# Patient Record
Sex: Female | Born: 2000 | Race: Black or African American | Hispanic: No | Marital: Single | Smoking: Never smoker
Health system: Southern US, Community
[De-identification: ages and names within clinical notes are randomized; demographics above are authoritative.]

## PROBLEM LIST (undated history)

## (undated) DIAGNOSIS — R011 Cardiac murmur, unspecified: Secondary | ICD-10-CM

## (undated) DIAGNOSIS — T7840XA Allergy, unspecified, initial encounter: Secondary | ICD-10-CM

## (undated) DIAGNOSIS — S62609A Fracture of unspecified phalanx of unspecified finger, initial encounter for closed fracture: Secondary | ICD-10-CM

## (undated) DIAGNOSIS — T148XXA Other injury of unspecified body region, initial encounter: Secondary | ICD-10-CM

## (undated) HISTORY — DX: Allergy, unspecified, initial encounter: T78.40XA

## (undated) HISTORY — DX: Other injury of unspecified body region, initial encounter: T14.8XXA

## (undated) HISTORY — DX: Fracture of unspecified phalanx of unspecified finger, initial encounter for closed fracture: S62.609A

## (undated) HISTORY — PX: NO PAST SURGERIES: SHX2092

---
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
## 2020-01-06 ENCOUNTER — Telehealth: Payer: BC Managed Care – PPO | Admitting: Child and Adolescent Psychiatry

## 2020-01-06 ENCOUNTER — Other Ambulatory Visit: Payer: Self-pay

## 2020-01-06 NOTE — Progress Notes (Unsigned)
Virtual Visit via Video Note  I connected with Toni Roberts on 01/06/20 at 11:30 AM EDT by a video enabled telemedicine application and verified that I am speaking with the correct person using two identifiers.  Location: Patient: home Provider: home office in [place]   I discussed the limitations of evaluation and management by telemedicine and the availability of in person appointments. The patient expressed understanding and agreed to proceed.    I discussed the assessment and treatment plan with the patient. The patient was provided an opportunity to ask questions and all were answered. The patient agreed with the plan and demonstrated an understanding of the instructions.   The patient was advised to call back or seek an in-person evaluation if the symptoms worsen or if the condition fails to improve as anticipated.  I provided 25 minutes of non-face-to-face time during this encounter.   Toni SmallingHiren M Ima Hafner, MD    [facility] OP Progress Note  01/06/2020 11:40 AM Toni Roberts  MRN:  [id number]  Chief Complaint: Medication management follow-up for anxiety, depression, ADHD.  HPI: This is an 19 year old African-American female, currently attending [name] with medical history significant of concussion in 2019 and psychiatric history significant of depression, anxiety and ADHD was seen and evaluated over telemedicine encounter for medication management follow-up.  At the last appointment patient was recommended to continue Effexor XR 37.5 mg once a day and Vyvanse 20 mg once a day.  Patient had an appointment for follow-up in July however she did not show up for that appointment and scheduled follow-up today.  Toni Roberts reports that she stopped taking both her Effexor and Vyvanse last week.  She reports that she has been taking her medications every day since the last appointment with this writer however her prescription for Vyvanse was only sent for 30 days in June  and not clear if she was still taking Vyvanse.  She reports that she stopped taking these medications because she felt "robotic", on the medications.  She reports that she has been struggling with paying attention, gets easily distracted and has been behind with a lot of her classes because of her attention problem. She reports that not being able to pay attention to her class and her basketball games is frustrating to her.  She reports that she has also been feeling depressed, irritable, has lack of motivation, sleeping well, feeling tired and denies any thoughts of suicide or self-harm.  She denies problems with anxiety and rates her anxiety at 4 out of 10(10 = most anxious).  Current stressors include not being able to do well in the college.  We discussed to take stepwise approach with medication management.  Discussed to try medication for ADHD first and then consider medication for depression.  We discussed to try Concerta 18 mg once a day and and continue with individual therapy for now for depression.  We discussed to have follow-up every week for therapy.  We discussed to consider medication for depression at the next appointment in 2 weeks.  Patient verbalized understanding and agreed with plan.  Visit Diagnosis:  No diagnosis found.   Past Psychiatric History: As mentioned in initial H&P, reviewed today, no change  Past Medical History:  Past Medical History:  Diagnosis Date  . Allergy    seasonal  . Fracture, finger 2014   right hand  . Heart murmur    recently dx by Dr Marney SettingByrnett-pts mom said this is the 1st time a doctor has  told her this-pt asymptomatic per [facility], pts mom  . Torn ligament 2017   left foot    Past Surgical History:  Procedure Laterality Date  . INGUINAL HERNIA REPAIR Right 07/01/2016   Primary repair right indirect inguinal hernia. Surgeon: Earline Mayotte, MD;  Location: [facility] ORS;  Service: General;  Laterality: Right;  . NO PAST SURGERIES      Family  Psychiatric History: As mentioned in initial H&P, reviewed today, no change  Family History:  Family History  Problem Relation Age of Onset  . Cancer Neg Hx     Social History:  Social History   Socioeconomic History  . Marital status: Single    Spouse name: Not on file  . Number of children: Not on file  . Years of education: Not on file  . Highest education level: Not on file  Occupational History  . Not on file  Tobacco Use  . Smoking status: Never Smoker  . Smokeless tobacco: Never Used  Substance and Sexual Activity  . Alcohol use: No    Alcohol/week: 0.0 standard drinks  . Drug use: No  . Sexual activity: Not on file  Other Topics Concern  . Not on file  Social History Narrative  . Not on file   Social Determinants of Health   Financial Resource Strain:   . Difficulty of Paying Living Expenses: Not on file  Food Insecurity:   . Worried About Programme researcher, broadcasting/film/video in the Last Year: Not on file  . Ran Out of Food in the Last Year: Not on file  Transportation Needs:   . Lack of Transportation (Medical): Not on file  . Lack of Transportation (Non-Medical): Not on file  Physical Activity:   . Days of Exercise per Week: Not on file  . Minutes of Exercise per Session: Not on file  Stress:   . Feeling of Stress : Not on file  Social Connections:   . Frequency of Communication with Friends and Family: Not on file  . Frequency of Social Gatherings with Friends and Family: Not on file  . Attends Religious Services: Not on file  . Active Member of Clubs or Organizations: Not on file  . Attends Banker Meetings: Not on file  . Marital Status: Not on file    Allergies: No Known Allergies  Metabolic Disorder Labs: No results found for: HGBA1C, MPG No results found for: PROLACTIN No results found for: CHOL, TRIG, HDL, CHOLHDL, VLDL, LDLCALC No results found for: TSH  Therapeutic Level Labs: No results found for: LITHIUM No results found for:  VALPROATE No components found for:  CBMZ  Current Medications: Current Outpatient Medications  Medication Sig Dispense Refill  . cetirizine (ZYRTEC) 10 MG tablet Take 10 mg by mouth as needed for allergies.    . cetirizine (ZYRTEC) 10 MG tablet Take by mouth.    . fluticasone (FLONASE) 50 MCG/ACT nasal spray Place 1 spray into both nostrils daily as needed for allergies or rhinitis.    . fluticasone (FLONASE) 50 MCG/ACT nasal spray Place into the nose.    Marland Kitchen Ketotifen Fumarate (ALLERGY EYE DROPS OP) Apply 1 drop to eye daily as needed (allergies).    . methylphenidate (CONCERTA) 18 MG PO CR tablet Take 1 tablet (18 mg total) by mouth daily. 30 tablet 0  . Olopatadine HCl 0.2 % SOLN INSTILL 1 2 DROPS INTO BOTH EYES ONCE A DAY    . Saline (OCEAN NASAL SPRAY NA) Place 1 spray into  the nose as needed.    . traZODone (DESYREL) 50 MG tablet TAKE 1/2-1 TABLET (25-50 MG TOTAL) BY MOUTH AT BEDTIME AS NEEDED FOR SLEEP. 30 tablet 0   No current facility-administered medications for this visit.     Musculoskeletal: Strength & Muscle Tone: unable to assess since visit was over the telemedicine. Gait & Station: As mentioned in initial H&P, reviewed today, no change Patient leans: N/A  Psychiatric Specialty Exam: Review of Systems  There were no vitals taken for this visit.There is no height or weight on file to calculate BMI.  General Appearance: Casual, Fairly Groomed and wearing mask  Eye Contact:  Fair  Speech:  Clear and Coherent and Normal Rate  Volume:  Normal  Mood:  "ok"  Affect:  Appropriate, Congruent and Restricted  Thought Process:  Goal Directed and Linear  Orientation:  Full (Time, Place, and Person)  Thought Content: Logical   Suicidal Thoughts:  No  Homicidal Thoughts:  No  Memory:  Immediate;   Fair Recent;   Fair Remote;   Fair  Judgement:  Fair  Insight:  Fair  Psychomotor Activity:  Normal  Concentration:  Concentration: Fair and Attention Span: Fair  Recall:  [employer] of Knowledge: Fair  Language: Fair  Akathisia:  No    AIMS (if indicated): not done  Assets:  Manufacturing systems engineer Desire for Improvement Financial Resources/Insurance Housing Leisure Time Physical Health Social Support Transportation Vocational/Educational  ADL's:  Intact  Cognition: WNL  Sleep:  Fair   Screenings: For psychological evaluation 07/07/2019.  Patient had Margaret Pyle test of Cognitive abilities fourth edition, controlled oral Word Association test, snap 4, client interview and observation.  Summary and the recommendation of the psychological evaluation was reviewed.  Patient was diagnosed with ADHD and unspecified depressive disorder on psychological evaluation.   Assessment and Plan:   19 year-old AA female with prior psychiatric history of unspecified depressive disorder, anxiety disorder, ADHD inattentive type, referred for medication management for Depression, Anxiety and ADHD in 08/2019.  She does not appear to have any genetic predisposition to psychiatric illness.  During the initial evaluation she reported problems with attention, symptoms of depression(anhedonia, lack of motivation, poor appetite, sleeping problems, poor concentration and intermittent sad mood), anxiety(intermittent anxiety symptoms, panic attacks).  On initial evaluation she often responded to questions with vague answers.  She did not report any psychosocial stressors that have led to precipitation of depression and anxiety in the high school however it appears that transition to college, doing poorly with grades at college had worsened her anxiety and depression.  She appears to have strong social supports from her parents, future oriented, is engaged in sports, continues to enjoy leisurely activities, help seeking and in therapy which are protective factors for her.  Her presentation initially appeared most consistent with unspecified depressive disorder, unspecified anxiety disorder  and ADHD. She was therefore started on Vyvanse for ADHD and Effexor XR for mood and anxiety.   She has stopped taking both Vyvanse and Effexor because she does not believe it was helping her and also was making her "robotic".  It is unclear if patient was compliant to Vyvanse because last prescription was sent in June with 30-day supply however she reports that she was compliant to her medications.  Today she reports that she has been noticing worsening of attention problems and depressive symptoms.  She reports anxiety stable.  She reports that she continues to see therapist once a week.  We discussed to  start medication for ADHD first and in 2 weeks consider starting medication for depression.  She verbalizes understanding and agrees with the plan.  We discussed to try Concerta 18 mg once a day.  Discussed risks and benefits of starting Concerta and patient verbalized understanding and agreed with the plan.   Plan:  # ADHD(not improving) -Patient self discontinued Vyvanse 20 mg daily.  Recommending to start Concerta 18 mg once a day.  Prescription sent to [facility] in [place].  # Depression(not improving) and Anxiety(chronic, improving) -Patient has stopped taking Effexor XR 37.5 mg once a day.  Because of previous failed trials with antidepressant and her complaints of nonspecific side effects, recommended to start medication for ADHD.  With the plan to start antidepressant at next appointment.  Patient verbalized understanding and agreed with the plan.   -Continue individual therapy with Ms. Carollee Herter every week.   # Sleeping difficulties -Continue trazodone 25-50 mg QHS for sleep.   This note was generated in part or whole with voice recognition software. Voice recognition is usually quite accurate but there are transcription errors that can and very often do occur. I apologize for any typographical errors that were not detected and corrected.    Toni Smalling,  MD 01/06/2020, 11:40 AM

## 2020-02-06 ENCOUNTER — Telehealth: Payer: BC Managed Care – PPO | Admitting: Child and Adolescent Psychiatry

## 2020-02-13 ENCOUNTER — Telehealth (INDEPENDENT_AMBULATORY_CARE_PROVIDER_SITE_OTHER): Payer: BC Managed Care – PPO | Admitting: Child and Adolescent Psychiatry

## 2020-02-13 ENCOUNTER — Other Ambulatory Visit: Payer: Self-pay

## 2020-02-13 DIAGNOSIS — F411 Generalized anxiety disorder: Secondary | ICD-10-CM

## 2020-02-13 DIAGNOSIS — F3341 Major depressive disorder, recurrent, in partial remission: Secondary | ICD-10-CM

## 2020-02-13 DIAGNOSIS — F902 Attention-deficit hyperactivity disorder, combined type: Secondary | ICD-10-CM

## 2020-02-13 MED ORDER — HYDROXYZINE HCL 25 MG PO TABS: 25.0000 mg | ORAL_TABLET | Freq: Every evening | ORAL | 0 refills | Status: AC | PRN

## 2020-02-13 MED ORDER — ATOMOXETINE HCL 10 MG PO CAPS: 10.0000 mg | ORAL_CAPSULE | Freq: Every day | ORAL | 0 refills | Status: DC

## 2020-02-13 NOTE — Progress Notes (Signed)
Virtual Visit via Video Note  I connected with Toni Roberts on 02/13/20 at 11:00 AM EST by a video enabled telemedicine application and verified that I am speaking with the correct person using two identifiers.  Location: Patient: Toni Roberts, [place] Provider: home    I discussed the limitations of evaluation and management by telemedicine and the availability of in person appointments. The patient expressed understanding and agreed to proceed.    I discussed the assessment and treatment plan with the patient. The patient was provided an opportunity to ask questions and all were answered. The patient agreed with the plan and demonstrated an understanding of the instructions.   The patient was advised to call back or seek an in-person evaluation if the symptoms worsen or if the condition fails to improve as anticipated.  I provided 25 minutes of non-face-to-face time during this encounter.   Darcel Smalling, MD    [facility] MD/PA/NP OP Progress Note  02/13/2020 11:36 AM Toni Roberts  MRN:  [id number]  Chief Complaint: Medication management follow-up for anxiety, depression, ADHD.  HPI: This is an 19 year old African-American female, currently [name] with medical history significant of concussion in 2019 and psychiatric history significant of depression, anxiety and ADHD was seen and evaluated over telemedicine encounter for medication management follow-up.  At the last appointment patient was recommended to start Concerta 18 mg once a day and prior to her last appointment she already discontinued her Vyvanse and Effexor.  In the interim since the last appointment patient no showed for her follow-up appointment and scheduled to follow-up for today.  Today she reports that she continues to struggle with attention problem and need medications to help her with ADHD.  She reports that she has not been taking Concerta since she ran out and Concerta was  helping her a little but also causing more anxiety and made her feel depressed.  She reports that since discontinuation of Concerta she is not feeling anxious and her mood has been neutral.    In regards of anxiety she reports that her anxiety has been stable and states her anxiety is 2 out of 10(10 = most anxious).  She describes her mood as "just there", denies any low lows or depressed mood.  She denies any SI, anhedonia.  She reports that she continues to struggle with onset of sleep and has been taking NyQuil which has been helpful.  She reports that she continues to struggle with appetite but it was much worse on Concerta.  She reports that she continues to see her therapist about once a week and enjoys working with her therapist.  She reports that since the last appointment she was able to catch up with her college work and her college has been decent.  She reports that she continues to play with her school's basketball team.  We discussed that since she did not do well on Vyvanse or Concerta, would recommend trying Strattera.  Discussed risks and benefits including but not limited to black box warning of suicidal thoughts associated with Strattera.  Patient verbalized understanding and provided verbal informed consent.  We discussed to have follow-up in 3 weeks or earlier if needed.   Visit Diagnosis:    ICD-10-CM   1. Attention deficit hyperactivity disorder (ADHD), combined type  F90.2 atomoxetine (STRATTERA) 10 MG capsule  2. Recurrent major depressive disorder, in partial remission (HCC)  F33.41   3. Generalized anxiety disorder  F41.1 hydrOXYzine (ATARAX/VISTARIL) 25 MG tablet  Past Psychiatric History:  Zoloft (upto 75 mg) caused anxiety Effexor - Felt Robotic Remeron - Not helpful Concerta - reported worsening of mood and anxiety Vyvanse - Felt robotic.   Past Medical History:  Past Medical History:  Diagnosis Date  . Allergy    seasonal  . Fracture, finger 2014    right hand  . Heart murmur    recently dx by Dr Marney Setting mom said this is the 1st time a doctor has told her this-pt asymptomatic per [facility], pts mom  . Torn ligament 2017   left foot    Past Surgical History:  Procedure Laterality Date  . INGUINAL HERNIA REPAIR Right 07/01/2016   Primary repair right indirect inguinal hernia. Surgeon: Earline Mayotte, MD;  Location: [facility] ORS;  Service: General;  Laterality: Right;  . NO PAST SURGERIES      Family Psychiatric History: As mentioned in initial H&P, reviewed today, no change  Family History:  Family History  Problem Relation Age of Onset  . Cancer Neg Hx     Social History:  Social History   Socioeconomic History  . Marital status: Single    Spouse name: Not on file  . Number of children: Not on file  . Years of education: Not on file  . Highest education level: Not on file  Occupational History  . Not on file  Tobacco Use  . Smoking status: Never Smoker  . Smokeless tobacco: Never Used  Substance and Sexual Activity  . Alcohol use: No    Alcohol/week: 0.0 standard drinks  . Drug use: No  . Sexual activity: Not on file  Other Topics Concern  . Not on file  Social History Narrative  . Not on file   Social Determinants of Health   Financial Resource Strain:   . Difficulty of Paying Living Expenses: Not on file  Food Insecurity:   . Worried About Programme researcher, broadcasting/film/video in the Last Year: Not on file  . Ran Out of Food in the Last Year: Not on file  Transportation Needs:   . Lack of Transportation (Medical): Not on file  . Lack of Transportation (Non-Medical): Not on file  Physical Activity:   . Days of Exercise per Week: Not on file  . Minutes of Exercise per Session: Not on file  Stress:   . Feeling of Stress : Not on file  Social Connections:   . Frequency of Communication with Friends and Family: Not on file  . Frequency of Social Gatherings with Friends and Family: Not on file  . Attends Religious  Services: Not on file  . Active Member of Clubs or Organizations: Not on file  . Attends Banker Meetings: Not on file  . Marital Status: Not on file    Allergies: No Known Allergies  Metabolic Disorder Labs: No results found for: HGBA1C, MPG No results found for: PROLACTIN No results found for: CHOL, TRIG, HDL, CHOLHDL, VLDL, LDLCALC No results found for: TSH  Therapeutic Level Labs: No results found for: LITHIUM No results found for: VALPROATE No components found for:  CBMZ  Current Medications: Current Outpatient Medications  Medication Sig Dispense Refill  . atomoxetine (STRATTERA) 10 MG capsule Take 1 capsule (10 mg total) by mouth daily. 30 capsule 0  . cetirizine (ZYRTEC) 10 MG tablet Take 10 mg by mouth as needed for allergies.    . cetirizine (ZYRTEC) 10 MG tablet Take by mouth.    . fluticasone (FLONASE) 50 MCG/ACT nasal spray  Place 1 spray into both nostrils daily as needed for allergies or rhinitis.    . fluticasone (FLONASE) 50 MCG/ACT nasal spray Place into the nose.    . hydrOXYzine (ATARAX/VISTARIL) 25 MG tablet Take 1 tablet (25 mg total) by mouth at bedtime as needed (Sleep). 30 tablet 0  . Ketotifen Fumarate (ALLERGY EYE DROPS OP) Apply 1 drop to eye daily as needed (allergies).    . Olopatadine HCl 0.2 % SOLN INSTILL 1 2 DROPS INTO BOTH EYES ONCE A DAY    . Saline (OCEAN NASAL SPRAY NA) Place 1 spray into the nose as needed.    . traZODone (DESYREL) 50 MG tablet TAKE 1/2-1 TABLET (25-50 MG TOTAL) BY MOUTH AT BEDTIME AS NEEDED FOR SLEEP. 30 tablet 0   No current facility-administered medications for this visit.     Musculoskeletal: Strength & Muscle Tone: unable to assess since visit was over the telemedicine. Gait & Station: As mentioned in initial H&P, reviewed today, no change Patient leans: N/A  Psychiatric Specialty Exam: Review of Systems  There were no vitals taken for this visit.There is no height or weight on file to calculate BMI.   General Appearance: Casual and Fairly Groomed  Eye Contact:  Fair  Speech:  Clear and Coherent and Normal Rate  Volume:  Normal  Mood:  "just there.."  Affect:  Appropriate, Congruent and Restricted  Thought Process:  Goal Directed and Linear  Orientation:  Full (Time, Place, and Person)  Thought Content: Logical   Suicidal Thoughts:  No  Homicidal Thoughts:  No  Memory:  Immediate;   Fair Recent;   Fair Remote;   Fair  Judgement:  Fair  Insight:  Fair  Psychomotor Activity:  Normal  Concentration:  Concentration: Fair and Attention Span: Fair  Recall:  [employer] of Knowledge: Fair  Language: Fair  Akathisia:  No    AIMS (if indicated): not done  Assets:  Manufacturing systems engineer Desire for Improvement Financial Resources/Insurance Housing Leisure Time Physical Health Social Support Transportation Vocational/Educational  ADL's:  Intact  Cognition: WNL  Sleep:  Fair   Screenings: For psychological evaluation 07/07/2019.  Patient had Margaret Pyle test of Cognitive abilities fourth edition, controlled oral Word Association test, snap 4, client interview and observation.  Summary and the recommendation of the psychological evaluation was reviewed.  Patient was diagnosed with ADHD and unspecified depressive disorder on psychological evaluation.   Assessment and Plan:   19 year-old AA female with prior psychiatric history of unspecified depressive disorder, anxiety disorder, ADHD inattentive type, referred for medication management for Depression, Anxiety and ADHD in 08/2019.  She does not appear to have any genetic predisposition to psychiatric illness.    During the initial evaluation she reported problems with attention, symptoms of depression(anhedonia, lack of motivation, poor appetite, sleeping problems, poor concentration and intermittent sad mood), anxiety(intermittent anxiety symptoms, panic attacks).  On initial evaluation she often responded to questions with  vague answers.  She did not report any psychosocial stressors that have led to precipitation of depression and anxiety in the high school however it appears that transition to college, doing poorly with grades at college had worsened her anxiety and depression.  She appears to have strong social supports from her parents, future oriented, is engaged in sports, continues to enjoy leisurely activities, help seeking and in therapy which are protective factors for her.  Her presentation initially appeared most consistent with unspecified depressive disorder, unspecified anxiety disorder and ADHD. She was therefore started on  Vyvanse for ADHD and Effexor XR for mood and anxiety on initial evaluation.   Subsequently she had been partly compliant to her meds, stopped Vyvanse and Effexor because she does not believe it was helping her and also was making her "robotic". She was then switched to concerta and recommended to follow up in 2 weeks. She now was followed up two months later and reported Concerta caused worsening of anxiety and depression, which improved with discontinuiation of concerta. Continues to struggle with ADHD, mood and anxiety appear stable. Recommending Straterra 10 mg daily due to poor response to Vyvanse and Concerta.    Plan:  # ADHD(not improving) -Start Straterra 10 mg daily - Side effects including but not limited to nausea, vomiting, diarrhea, constipation, headaches, dizziness, black box warning of suicidal thoughts with Blase MessStraterra were discussed with pt and parents. Mother provided informed consent.    # Depression(in remission) and Anxiety(chronic, stable) -Continue individual therapy with Ms. Carollee HerterAngie Dickey every week.   # Sleeping difficulties -Stop trazodone 25-50 mg QHS for sleep and start Atarax 25 mg QHS PRN for sleep.   This note was generated in part or whole with voice recognition software. Voice recognition is usually quite accurate but there are transcription  errors that can and very often do occur. I apologize for any typographical errors that were not detected and corrected.    Darcel SmallingHiren M Makara Lanzo, MD 02/13/2020, 11:36 AM

## 2020-03-06 ENCOUNTER — Other Ambulatory Visit: Payer: Self-pay

## 2020-03-06 ENCOUNTER — Encounter: Payer: Self-pay | Admitting: Child and Adolescent Psychiatry

## 2020-03-06 ENCOUNTER — Telehealth (INDEPENDENT_AMBULATORY_CARE_PROVIDER_SITE_OTHER): Payer: BC Managed Care – PPO | Admitting: Child and Adolescent Psychiatry

## 2020-03-06 DIAGNOSIS — F411 Generalized anxiety disorder: Secondary | ICD-10-CM | POA: Diagnosis not present

## 2020-03-06 DIAGNOSIS — F902 Attention-deficit hyperactivity disorder, combined type: Secondary | ICD-10-CM | POA: Diagnosis not present

## 2020-03-06 DIAGNOSIS — F331 Major depressive disorder, recurrent, moderate: Secondary | ICD-10-CM | POA: Diagnosis not present

## 2020-03-06 MED ORDER — ATOMOXETINE HCL 18 MG PO CAPS: 18.0000 mg | ORAL_CAPSULE | Freq: Every day | ORAL | 0 refills | Status: DC

## 2020-03-06 MED ORDER — BUPROPION HCL ER (XL) 150 MG PO TB24: 150.0000 mg | ORAL_TABLET | Freq: Every day | ORAL | 0 refills | Status: DC

## 2020-03-06 NOTE — Progress Notes (Signed)
Virtual Visit via Video Note  I connected with Toni Roberts on 03/06/20 at  1:00 PM EST by a video enabled telemedicine application and verified that I am speaking with the correct person using two identifiers.  Appointment attended by - Patient and Parent  Location: Patient: Toni Roberts, [place] Provider: home    I discussed the limitations of evaluation and management by telemedicine and the availability of in person appointments. The patient expressed understanding and agreed to proceed.    I discussed the assessment and treatment plan with the patient. The patient was provided an opportunity to ask questions and all were answered. The patient agreed with the plan and demonstrated an understanding of the instructions.   The patient was advised to call back or seek an in-person evaluation if the symptoms worsen or if the condition fails to improve as anticipated.  I provided 25 minutes of non-face-to-face time during this encounter.   Toni Smalling, MD    [facility] MD/PA/NP OP Progress Note  03/06/2020 2:19 PM Toni Roberts  MRN:  [id number]  Chief Complaint: Medication management follow-up for anxiety, depression, ADHD.  HPI: This is an 19 year old African-American female, currently attending [name] with medical history significant of concussion in 2019 and psychiatric history significant of depression, anxiety and ADHD was seen and evaluated over telemedicine encounter for medication management follow-up.    At the last appointment patient was recommended to start taking Strattera 10 mg once a day after self discontinuing Concerta.  Today she reports that she has been tolerating Strattera well without any side effects however has not noticed any improvement.  She also reports that she has been feeling more depressed for the last 1 month.  She reports that she has been feeling "numb", has low motivation, has problems with appetite and sleep, denies any  suicidal thoughts or self-harm thoughts.  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  In regards of anxiety she reports that her anxiety has been minimal and manageable.  She reports that she continues to see her therapist on a weekly basis.  She asked medication management options for her depression.  We discussed to try Wellbutrin XL 150 mg once a day.  Discussed risks and benefits including but not limited to black box warning associated with Wellbutrin XL.  She verbalized understanding and provided verbal informed consent.  She also asked if she can provide a letter for service dog.  Writer discussed limitations regarding providing such a letter.  She verbalized understanding.  We also discussed to increase Strattera to 18 mg once a day for the treatment for ADHD.  She verbalized understanding and agreed with plan.  Visit Diagnosis:    ICD-10-CM   1. Moderate episode of recurrent major depressive disorder (HCC)  F33.1 buPROPion (WELLBUTRIN XL) 150 MG 24 hr tablet  2. Attention deficit hyperactivity disorder (ADHD), combined type  F90.2 atomoxetine (STRATTERA) 18 MG capsule  3. Generalized anxiety disorder  F41.1 buPROPion (WELLBUTRIN XL) 150 MG 24 hr tablet     Past Psychiatric History:  Zoloft (upto 75 mg) caused anxiety Effexor - Felt Robotic Remeron - Not helpful Concerta - reported worsening of mood and anxiety Vyvanse - Felt robotic.   Past Medical History:  Past Medical History:  Diagnosis Date   Allergy    seasonal   Fracture, finger 2014   right hand   Heart murmur    recently dx by Dr Marney Setting mom said this is the  1st time a doctor has told her this-pt asymptomatic per Toni Roberts, pts mom   Torn ligament 2017   left foot    Past Surgical History:  Procedure Laterality Date   INGUINAL HERNIA REPAIR Right 07/01/2016   Primary repair right indirect inguinal hernia. Surgeon: Earline MayotteJeffrey W Byrnett, MD;  Location: [facility] ORS;  Service: General;   Laterality: Right;   NO PAST SURGERIES      Family Psychiatric History: As mentioned in initial H&P, reviewed today, no change  Family History:  Family History  Problem Relation Age of Onset   Cancer Neg Hx     Social History:  Social History   Socioeconomic History   Marital status: Single    Spouse name: Not on file   Number of children: Not on file   Years of education: Not on file   Highest education level: Not on file  Occupational History   Not on file  Tobacco Use   Smoking status: Never Smoker   Smokeless tobacco: Never Used  Substance and Sexual Activity   Alcohol use: No    Alcohol/week: 0.0 standard drinks   Drug use: No   Sexual activity: Not on file  Other Topics Concern   Not on file  Social History Narrative   Not on file   Social Determinants of Health   Financial Resource Strain:    Difficulty of Paying Living Expenses: Not on file  Food Insecurity:    Worried About Running Out of Food in the Last Year: Not on file   Ran Out of Food in the Last Year: Not on file  Transportation Needs:    Lack of Transportation (Medical): Not on file   Lack of Transportation (Non-Medical): Not on file  Physical Activity:    Days of Exercise per Week: Not on file   Minutes of Exercise per Session: Not on file  Stress:    Feeling of Stress : Not on file  Social Connections:    Frequency of Communication with Friends and Family: Not on file   Frequency of Social Gatherings with Friends and Family: Not on file   Attends Religious Services: Not on file   Active Member of Clubs or Organizations: Not on file   Attends BankerClub or Organization Meetings: Not on file   Marital Status: Not on file    Allergies: No Known Allergies  Metabolic Disorder Labs: No results found for: HGBA1C, MPG No results found for: PROLACTIN No results found for: CHOL, TRIG, HDL, CHOLHDL, VLDL, LDLCALC No results found for: TSH  Therapeutic Level Labs: No  results found for: LITHIUM No results found for: VALPROATE No components found for:  CBMZ  Current Medications: Current Outpatient Medications  Medication Sig Dispense Refill   atomoxetine (STRATTERA) 18 MG capsule Take 1 capsule (18 mg total) by mouth daily. 30 capsule 0   buPROPion (WELLBUTRIN XL) 150 MG 24 hr tablet Take 1 tablet (150 mg total) by mouth daily. 30 tablet 0   cetirizine (ZYRTEC) 10 MG tablet Take 10 mg by mouth as needed for allergies.     cetirizine (ZYRTEC) 10 MG tablet Take by mouth.     fluticasone (FLONASE) 50 MCG/ACT nasal spray Place 1 spray into both nostrils daily as needed for allergies or rhinitis.     fluticasone (FLONASE) 50 MCG/ACT nasal spray Place into the nose.     hydrOXYzine (ATARAX/VISTARIL) 25 MG tablet Take 1 tablet (25 mg total) by mouth at bedtime as needed (Sleep). 30 tablet 0  Ketotifen Fumarate (ALLERGY EYE DROPS OP) Apply 1 drop to eye daily as needed (allergies).     Olopatadine HCl 0.2 % SOLN INSTILL 1 2 DROPS INTO BOTH EYES ONCE A DAY     Saline (OCEAN NASAL SPRAY NA) Place 1 spray into the nose as needed.     No current facility-administered medications for this visit.     Musculoskeletal: Strength & Muscle Tone: unable to assess since visit was over the telemedicine. Gait & Station: As mentioned in initial H&P, reviewed today, no change Patient leans: N/A  Psychiatric Specialty Exam: Review of Systems  There were no vitals taken for this visit.There is no height or weight on file to calculate BMI.  General Appearance: Casual and Fairly Groomed  Eye Contact:  Fair  Speech:  Clear and Coherent and Normal Rate  Volume:  Normal  Mood:  Depressed  Affect:  Appropriate, Congruent and Restricted  Thought Process:  Goal Directed and Linear  Orientation:  Full (Time, Place, and Person)  Thought Content: Logical   Suicidal Thoughts:  No  Homicidal Thoughts:  No  Memory:  Immediate;   Fair Recent;   Fair Remote;   Fair   Judgement:  Fair  Insight:  Fair  Psychomotor Activity:  Normal  Concentration:  Concentration: Fair and Attention Span: Fair  Recall:  [employer] of Knowledge: Fair  Language: Fair  Akathisia:  No    AIMS (if indicated): not done  Assets:  Communication Skills Desire for Improvement Financial Resources/Insurance Housing Leisure Time Physical Health Social Support Transportation Vocational/Educational  ADL's:  Intact  Cognition: WNL  Sleep:  Fair   Screenings: Psychological evaluation 07/07/2019.  Patient had Margaret Pyle test of Cognitive abilities fourth edition, controlled oral Word Association test, snap 4, client interview and observation.  Summary and the recommendation of the psychological evaluation was reviewed.  Patient was diagnosed with ADHD and unspecified depressive disorder on psychological evaluation.   Assessment and Plan:   19 year-old AA female with prior psychiatric history of unspecified depressive disorder, anxiety disorder, ADHD inattentive type, referred for medication management for Depression, Anxiety and ADHD in 08/2019.  She does not appear to have any genetic predisposition to psychiatric illness.    During the initial evaluation she reported problems with attention, symptoms of depression(anhedonia, lack of motivation, poor appetite, sleeping problems, poor concentration and intermittent sad mood), anxiety(intermittent anxiety symptoms, panic attacks).  On initial evaluation she often responded to questions with vague answers.  She did not report any psychosocial stressors that have led to precipitation of depression and anxiety in the high school however it appears that transition to college, doing poorly with grades at college had worsened her anxiety and depression.  She appears to have strong social supports from her parents, future oriented, is engaged in sports, continues to enjoy leisurely activities, help seeking and in therapy which are  protective factors for her.  Her presentation initially appeared most consistent with unspecified depressive disorder, unspecified anxiety disorder and ADHD. She was therefore started on Vyvanse for ADHD and Effexor XR for mood and anxiety on initial evaluation.   Subsequently she had been partly compliant to her meds, stopped Vyvanse and Effexor because she did not believe it was helping her and also was making her "robotic". She was then switched to concerta and recommended to follow up in 2 weeks and was followed up 2 months later at the last appointment and was started on Straterra for ADHD and reported improvement in mood  and anxiety, now she is reporting worsening of depression but stability in anxiety. Recommended to add Wellbutrin XL 150 mg daily, and increase Straterra to 18 mg daily.   Plan:  # ADHD(not improving) -Increase Straterra to 18 mg daily - Side effects including but not limited to nausea, vomiting, diarrhea, constipation, headaches, dizziness, black box warning of suicidal thoughts with Blase Mess were discussed with pt and parents. Mother provided informed consent.    # Depression(worse) and Anxiety(chronic, stable) -Continue individual therapy with Ms. Carollee Herter every week. - Start Wellbutrin XL 150 mg daily.    # Sleeping difficulties -Continue with Atarax 25 mg QHS PRN for sleep.   This note was generated in part or whole with voice recognition software. Voice recognition is usually quite accurate but there are transcription errors that can and very often do occur. I apologize for any typographical errors that were not detected and corrected.    Toni Smalling, MD 03/06/2020, 2:19 PM

## 2020-03-07 ENCOUNTER — Telehealth: Payer: BC Managed Care – PPO | Admitting: Child and Adolescent Psychiatry

## 2020-03-07 ENCOUNTER — Other Ambulatory Visit: Payer: Self-pay | Admitting: Child and Adolescent Psychiatry

## 2020-03-07 DIAGNOSIS — F411 Generalized anxiety disorder: Secondary | ICD-10-CM

## 2020-03-07 DIAGNOSIS — F902 Attention-deficit hyperactivity disorder, combined type: Secondary | ICD-10-CM

## 2020-04-03 ENCOUNTER — Telehealth: Payer: Self-pay | Admitting: *Deleted

## 2020-04-03 NOTE — Telephone Encounter (Signed)
Headaches can occur with Wellbutrin, migraine is rare occurrence with Wellbutrin. She should let the clinic know about the medications she is on. She can still continue taking these medications while on concussion protocol.

## 2020-04-03 NOTE — Telephone Encounter (Signed)
Patient called and wanted to know if her medications could possible be causing her migraines.  She is on a concusion protocol.  Please review and advise.  Should she talk to the clinic that put her on the protocol?

## 2020-04-04 NOTE — Telephone Encounter (Signed)
Thanks

## 2020-04-04 NOTE — Telephone Encounter (Signed)
Placed call to patient to notify

## 2020-04-11 ENCOUNTER — Other Ambulatory Visit: Payer: Self-pay | Admitting: Child and Adolescent Psychiatry

## 2020-04-11 DIAGNOSIS — F331 Major depressive disorder, recurrent, moderate: Secondary | ICD-10-CM

## 2020-04-11 DIAGNOSIS — F902 Attention-deficit hyperactivity disorder, combined type: Secondary | ICD-10-CM

## 2020-04-11 DIAGNOSIS — F411 Generalized anxiety disorder: Secondary | ICD-10-CM

## 2022-08-15 ENCOUNTER — Emergency Department: Payer: BC Managed Care – PPO

## 2022-08-15 ENCOUNTER — Emergency Department
Admission: EM | Admit: 2022-08-15 | Discharge: 2022-08-15 | Disposition: A | Discharge status: Home / Self Care | Payer: BC Managed Care – PPO | Attending: Emergency Medicine | Admitting: Emergency Medicine

## 2022-08-15 DIAGNOSIS — J029 Acute pharyngitis, unspecified: Secondary | ICD-10-CM | POA: Diagnosis present

## 2022-08-15 DIAGNOSIS — Z85850 Personal history of malignant neoplasm of thyroid: Secondary | ICD-10-CM | POA: Diagnosis not present

## 2022-08-15 LAB — COMPREHENSIVE METABOLIC PANEL
ALT: 10 U/L (ref 0–44)
AST: 20 U/L (ref 15–41)
Albumin: 4.5 g/dL (ref 3.5–5.0)
Alkaline Phosphatase: 42 U/L (ref 38–126)
Anion gap: 11 (ref 5–15)
BUN: 9 mg/dL (ref 6–20)
CO2: 23 mmol/L (ref 22–32)
Calcium: 9.5 mg/dL (ref 8.9–10.3)
Chloride: 102 mmol/L (ref 98–111)
Creatinine, Ser: 0.8 mg/dL (ref 0.44–1.00)
GFR, Estimated: >60 mL/min (ref >60)
Glucose, Bld: 92 mg/dL (ref 70–99)
Potassium: 3.5 mmol/L (ref 3.5–5.1)
Sodium: 136 mmol/L (ref 135–145)
Total Bilirubin: 0.7 mg/dL (ref 0.3–1.2)
Total Protein: 7.7 g/dL (ref 6.5–8.1)

## 2022-08-15 LAB — CBC
HCT: 41.8 % (ref 36.0–46.0)
Hemoglobin: 13.8 g/dL (ref 12.0–15.0)
MCH: 29.6 pg (ref 26.0–34.0)
MCHC: 33 g/dL (ref 30.0–36.0)
MCV: 89.7 fL (ref 80.0–100.0)
Platelets: 270 10*3/uL (ref 150–400)
RBC: 4.66 MIL/uL (ref 3.87–5.11)
RDW: 13.4 % (ref 11.5–15.5)
WBC: 8 10*3/uL (ref 4.0–10.5)
nRBC: 0 % (ref 0.0–0.2)

## 2022-08-15 LAB — POC URINE PREG, ED: Preg Test, Ur: NEGATIVE

## 2022-08-15 LAB — MONONUCLEOSIS SCREEN: Mono Screen: NEGATIVE

## 2022-08-15 MED ORDER — ALUM & MAG HYDROXIDE-SIMETH 200-200-20 MG/5ML PO SUSP
30.0000 mL | Freq: Once | ORAL | Status: AC
  Administered 2022-08-15: 30 mL via ORAL
  Filled 2022-08-15: qty 30

## 2022-08-15 NOTE — ED Triage Notes (Addendum)
Pt sts that she was at her PCP and they prescribed her an antibiotic and steroids. Pt sts that she started to feel better but his AM she woke up and her throat is hurting and she is concerned. Pt is drinking slushy  while in triage. Pt sts that she is feeling like something in her throat. 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

## 2022-08-15 NOTE — Discharge Instructions (Signed)
Follow-up with your regular doctor if not improving 3 days.  Your x-ray today is normal.  Your labs are normal.  Your thyroid level and monotest were result later today.  May check these on [facility] MyChart.  If the number on the thyroid is high that means you have hypothyroidism, if it is low that means you have hyperthyroidism and this would be a reason to follow-up with your regular doctor.  If your monotest is positive then you will need to avoid contact sports for 6 weeks. If the GI cocktail helps with your symptoms buy over-the-counter omeprazole to help with reflux symptoms

## 2022-08-15 NOTE — ED Provider Notes (Signed)
[facility] Provider Note    Event Date/Time   First MD Initiated Contact with Patient 08/15/22 1819     (approximate)   History   Sore Throat   HPI  Toni Roberts is a 22 y.o. 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  No fever or chills.  Has seen 2 previous doctors for the same.  Had a strep test performed which was negative.  Was given Z-Pak which she is currently taking.  Was seen at the urgent care and the emergency department where she goes to school.  Mother is concerned something is getting missed due to her past medical history.  Denies any concerns for STD in the throat.      Physical Exam   Triage Vital Signs: ED Triage Vitals  Enc Vitals Group     BP 08/15/22 1705 112/63     Pulse Rate 08/15/22 1705 72     Resp 08/15/22 1706 17     Temp 08/15/22 1705 98.7 F (37.1 C)     Temp Source 08/15/22 1705 Oral     SpO2 08/15/22 1705 96 %     Weight 08/15/22 1701 131 lb (59.4 kg)     Height --      Head Circumference --      Peak Flow --      Pain Score 08/15/22 1701 0     Pain Loc --      Pain Edu? --      Excl. in GC? --     Most recent vital signs: Vitals:   08/15/22 1705 08/15/22 1706  BP: 112/63   Pulse: 72   Resp:  17  Temp: 98.7 F (37.1 C)   SpO2: 96%      General: Awake, no distress.   CV:  Good peripheral perfusion. regular rate and  rhythm Resp:  Normal effort. Lungs cta Abd:  No distention.   Other:  Surgical scar noted at the anterior neck, neck is supple, no lymphadenopathy, right tonsil is a little larger than the left however do not see any exudate or concerns for PTA   ED Results / Procedures / Treatments   Labs (all labs ordered are listed, but only abnormal results are displayed) Labs Reviewed  CBC   COMPREHENSIVE METABOLIC PANEL  MONONUCLEOSIS SCREEN  TSH  POC URINE PREG, ED     EKG     RADIOLOGY X-ray soft tissue of the neck    PROCEDURES:   Procedures   MEDICATIONS ORDERED IN ED: Medications  alum & mag hydroxide-simeth (MAALOX/MYLANTA) 200-200-20 MG/5ML suspension 30 mL (has no administration in time range)     IMPRESSION / MDM / ASSESSMENT AND PLAN / ED COURSE  I reviewed the triage vital signs and the nursing notes.                              Differential diagnosis includes, but is not limited to, thyroid tumor, esophagitis mono, hypothyroidism,  Patient's presentation is most consistent with acute illness / injury with system symptoms.   Patient's basic labs are reassuring  For speaking with the patient and her mother I will add a monotest and TSH.  Explained to the mother they can look  these up on [facility] MyChart as will not change her treatment here in the ED.  However will do a soft tissue x-ray of the neck to ensure the airways patent and does not look like any new growth in the area.   X-ray soft tissue of the neck was independently reviewed interpreted by me as being negative for acute abnormality  Explained the findings to the patient and her family member.  She is to follow-up with her regular doctor.  Return emergency department if worsening.  She is to look on [facility] MyChart concerning her thyroid level and her monotest.  She is in agreement with the treatment plan.  Given a GI cocktail at discharge.  Instructed to buy over-the-counter omeprazole if this medication helps with her symptoms.   FINAL CLINICAL IMPRESSION(S) / ED DIAGNOSES   Final diagnoses:  Sore throat     Rx / DC Orders   ED Discharge Orders     None        Note:  This document was prepared using Dragon voice recognition software and may include unintentional dictation errors.    Faythe Ghee, PA-C 08/15/22 1946    Trinna Post, MD 08/15/22  2011

## 2022-08-15 NOTE — ED Triage Notes (Signed)
Brought over from [facility]. C/o feeling like something is stuck in her throat X8 days. Causing issues with swallowing.   History tumor on thyroid   99% RA 110/62 b/p 101P

## 2022-08-15 NOTE — ED Notes (Signed)
Pt provided discharge instructions and prescription information. Pt was given the opportunity to ask questions and questions were answered.   

## 2022-09-06 NOTE — Therapy (Unsigned)
OUTPATIENT PHYSICAL THERAPY EVALUATION   Patient Name: Toni Roberts MRN: [id number] DOB:[birth year], 22 y.o., female Today's Date: 09/08/2022  END OF SESSION:  PT End of Session - 09/08/22 1919     Visit Number 1    Number of Visits 24    Date for PT Re-Evaluation 12/01/22    Authorization Type [employer] reporting period from 09/08/2022    Authorization Time Period VL: 30 PT/OT/Chiro    Authorization - Visit Number 1    Authorization - Number of Visits 30    Progress Note Due on Visit 10    PT Start Time 1115    PT Stop Time 1205    PT Time Calculation (min) 50 min    Activity Tolerance Patient limited by pain    Behavior During Therapy [facility] for tasks assessed/performed             Past Medical History:  Diagnosis Date   Allergy    seasonal   Fracture, finger 2014   right hand   Heart murmur    recently dx by Dr Marney Setting mom said this is the 1st time a doctor has told her this-pt asymptomatic per Madolyn Frieze, pts mom   Torn ligament 2017   left foot   Past Surgical History:  Procedure Laterality Date   INGUINAL HERNIA REPAIR Right 07/01/2016   Primary repair right indirect inguinal hernia. Surgeon: Earline Mayotte, MD;  Location: [facility] ORS;  Service: General;  Laterality: Right;   NO PAST SURGERIES     Patient Active Problem List   Diagnosis Date Noted   Moderate episode of recurrent major depressive disorder (HCC) 12/22/2019   Anxiety disorder 08/09/2019   Attention deficit hyperactivity disorder (ADHD), combined type 08/09/2019   Other insomnia 08/09/2019   Non-recurrent unilateral inguinal hernia without obstruction or gangrene 06/11/2016   Peroneal tendon injury 08/30/2015   Left ankle swelling 08/30/2015   Plica syndrome of left knee 09/26/2014    PCP: Charlton Amor, MD   REFERRING PROVIDER: Berkley Harvey, MD   REFERRING DIAG: synovial plica syndrome of left knee, 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 She states her condition started March 01, 2022 during a game when she hyperextended her left knee when she jumped up to get  a ball. She had immediate pain. She went to the doctor 4-5 days later. She had MRI that showed a partialy tear of the MCL, two bone bruises, some swollen tendons, and pinched left fat pad. (Physician's notes say it showed a hyperextension injury). She did some rehab at school but it wasn't helping, so she got some exercises from her relative that is Tree surgeon. She was unable to finish her sports season, so she has been out of basketball since November 25th. She played college basketball for [employer] ([place] but she is not sure where she is going next year because she is transferring. Her goal is to go back to playing college basketball. She had cortisone shots and medications that did not resolve her pain and she was unable to get back to cutting, recover on defense, or be effective  on the court. Her doctor recommended surgery after she had ongoing pain "primarily over the medial condyle with a diagnostic plica injection providing temporarily incomplete relief." (From Dr. Lynford Humphrey' documentation 08/25/2022).  She is now 2 weeks s/p left knee arthroscopy that included a limited synovectomy to the medial plica, limited chondroplasty consisting of medial femoral condyle debridement of grade 1 changes, and debridement to hypertrophic fat pad and ligamentum mucosum. She wore a brace prior to surgery but not after surgery. She states she has been working on some  of the exercises she was given by a PT (TJ with [facility]) she saw twice post op, and her knee is getting a little straighter. She tries to walk without a crutch but she brought the crutch today because she though her knee was going to be hurting. She uses it when she needs it.  She is taking ibuprofen and tylenol for it. Patient's mother states current PT is to call DJ at [facility] at the request of TJ and Dr. Lynford Humphrey. Patient's mother states Dr. Lynford Humphrey said patient should not have any pain when she comes back for her follow up with the him. Patient reports she was having a lot of pain before the surgery. She was having 5-8/10 pain prior to surgery depending on what she was doing. She not able to drive right now because she has been told not to drive until her knee doesn't hurt while sitting in the car. She is planning to start working out again focusing on upper body and abs. Patient's mother Toni Roberts was present for part of session.   Per Dr. Roxanne Mins documentation from 08/25/2022, patient had surgery related to ongoing pain "primarily over the medial condyle with a diagnostic plica injection providing temporarily incomplete relief."  From Op note 08/25/2022 (emphasis from this PT):   "PF joint: Normal patella and trochlear cartilage, normal patellar tracking Lateral compartment: intact meniscus, normal femoral and tibial articular cartilage Medial compartment: Intact meniscus. Small grade I chondromalacia over the medial femoral condyle, prominent plica adjacent to chondromalacia, normal tibial articular cartilage Notch: Intact ACL and PCL Gutters: no loose bodies in lateral or medial gutters"  "The medial plica was identified. A limited synovectomy was performed using an arthroscopic biter and an oscillating shaver. The grade 1 changes along the medial femoral condyle were also debrided with a shaver to complete a limited chondroplasty. The hypertrophic fat pat and ligamentum mucosum were also debrided to  visualize the contents of the notch. Arthroscopic fluid was removed from the joint."    PERTINENT HISTORY: Patient is a 22 y.o. 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  Patient denies hx of  cancer, stroke, seizures, lung problems, heart problems, diabetes, unexplained weight loss, unexplained changes in bowel or bladder problems, unexplained stumbling or dropping things, osteoporosis, and spinal surgery   PAIN:  Are you having pain? Yes: NPRS scale: Current: 6/10,  Best: 5/10, Worst: 9/10. Pain location: L knee, on top of patella, around the sides of patella, medial knee.  Pain description: depends, can be sharp Aggravating factors: walking for a long time, prolonged sitting,  Relieving factors: "it's going to be the same regardless" ice machine  FUNCTIONAL LIMITATIONS: cannot run, cannot play basketball, hard to navigate stairs, difficulty with walking for a long period of  time, bending, dressing, bathing, household responsibiltites, hiking, swimming, biking, jumping on trampoline,  climbing, etc.   LEISURE: hiking, biking, jumping on trampoline, climbing.   PRECAUTIONS: WBAT with axillary crutches as needed 0-6 weeks post op, see protocol in chart for more detailed precautions.   WEIGHT BEARING RESTRICTIONS: Yes WBAT with axillary crutches as needed 0-6 weeks post op  FALLS:  Has patient fallen in last 6 months? No  LIVING ENVIRONMENT: Lives with: mom and dad currently Lives in: home with basement (1 flight of stairs with right handrail).  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   PLOF: Independent  PATIENT GOALS: "get back to playing college basketball"  NEXT MD VISIT: July  OBJECTIVE  DIAGNOSTIC FINDINGS:  L knee MRI report from 03/10/2022:  MRI KNEE LEFT  WITHOUT CONTRAST   Indication: Knee trauma, internal derangement suspected, xray done, M25.362  Other instability, left knee   Comparison: None.   Technique: Noncontrast multiplanar, multiecho imaging of the knee was  performed, including T1-weighted and fluid sensitive sequences.    FINDINGS:   MEDIAL MENISCUS:  Intact.  LATERAL MENISCUS:  Intact.   ACL:  Intact.  PCL:  Intact.  MCL:  Intact. Subtle heterogeneous signal anterior margin mid fibers.  LATERAL LIGAMENTS AND TENDONS:  Intact   EXTENSOR MECHANISM:  The quadriceps and patellar tendons are intact.   FAT PADS:   Mild edema in the infrapatellar fat pad.   CARTILAGE:   Patellofemoral compartment:  No full-thickness defect detected.  Medial compartment:  No full-thickness defect detected.  Lateral compartment: No full-thickness defect detected.   BONE MARROW: Focal bone marrow edema at the anterior margin of the  lateral  compartment   OTHER: No joint effusion. Trace popliteal cyst. Trace popliteal soft tissue  edema.    IMPRESSION:  1. Focal contusions versus nondisplaced fractures anterior margin lateral  compartment.  2. Possible focal mild MCL sprain.  3.  Nonspecific soft tissue edema posterior knee.   Electronically Reviewed by:  Consuelo Pandy, MD, [facility]  Electronically Reviewed on:  03/11/2022 10:24 AM   I have reviewed the images and concur with the above findings.   Electronically Signed by:  Roma Schanz, MD, [facility]  Electronically Signed on:  03/11/2022 10:53 AM   SELF- REPORTED FUNCTION FOTO score: 28/100 (knee questionnaire)  OBSERVATION/INSPECTION Posture Posture (standing): stands with L knee slightly flexed and weight shifted away from L LE.  Anthropometrics Tremor: none Body composition: very lean Muscle bulk: decreased L quad with poor activation compared to R quad Skin: The incision sites appear to be healing well with no excessive redness, warmth, drainage or signs of infection present.  Incision sites covered by tape.  Edema: mild  to moderate swelling at left knee Functional Mobility Bed mobility: supine <> sit and rolling mod I for increased time, UE assistance to help move L LE at times.  Transfers: sit <> stand I with lack of full weightbearing on L LE. Gait: ambulates with and without single axillary crutch in right side with antalgic gait favoring L LE; L knee remains partially flexed in weight bearing.     PERIPHERAL JOINT MOTION (in degrees)  PASSIVE RANGE OF MOTION (PROM) *Indicates pain 09/08/22 Date Date  Joint/Motion R/L R/L R/L  Extension 8/5* / /  Flexion Sits on heel/130* / /  Comments:  09/08/2022: B LE appear grossly [facility] for basic mobility except as above   MUSCLE PERFORMANCE (MMT):  *Indicates pain 09/08/22 Date Date  Joint/Motion R/L R/L R/L  Hip     Flexion (L1, L2) 4+/3+* / /  Extension (knee ext) 5/2+* / /  Abduction 4+/3+*  / /  Adduction 4/2* / /  Knee     Extension (L3) 5/3* / /  Flexion (S2) 5/2* / /  Ankle/Foot     Dorsiflexion (L4) 5/5 / /  Great toe extension (L5) 5/5 / /  Plantarflexion (S1) 5/5 / /  Comments:  09/08/2022: patient unable to complete L ASLR due to pain and weakness.   ACCESSORY MOTION: L patella tender to mobilization in all directions, unable to tolerate superior movement due to needed pressure at patellar tendon.   PALPATION: TTP surrounding L patella, over fat pad, patellar tendon, joint line, less so at posterior knee.     TODAY'S TREATMENT:  Therapeutic exercise: to centralize symptoms and improve ROM, strength, muscular endurance, and activity tolerance required for successful completion of functional activities.  - long sitting/supine L quad set with towel roll behind knee, 1x7 with 5 second hold.  - hooklying L quad set to attempted ASLR with towel roll behind knee, 1x5 (unable to lift completely off towel roll).  - hooklying AAROM L knee flexion 1x5 with 5 second hold using strap - long sitting self mobilizations to patella medial, lateral, inferior plus demonstration and education on how and why to perform this.  - Education on diagnosis, prognosis, POC, anatomy and physiology of current condition.  - Education on HEP including handout   Pt required multimodal cuing for proper technique and to facilitate improved neuromuscular control, strength, range of motion, and functional ability resulting in improved performance and form.  PATIENT EDUCATION:  Education details: Exercise purpose/form. Self management techniques. Education on diagnosis, prognosis, POC, anatomy and physiology of current condition. Education on HEP including handout. Reviewed cancelation/no-show policy with patient and confirmed patient has correct phone number for clinic; patient verbalized understanding (09/08/22). Person educated: Patient Education method: Explanation, Demonstration, Tactile cues,  Verbal cues, and Handouts Education comprehension: verbalized understanding, returned demonstration, verbal cues required, tactile cues required, and needs further education  HOME EXERCISE PROGRAM: Access Code: [email or web address] URL: [email or web address] Date: 09/08/2022 Prepared by: Norton Blizzard  Exercises - Supine Quad Set  - 1-2 x daily - 2 sets - 10 reps - 5 seconds hold - Active Straight Leg Raise with Quad Set  - 1-2 x daily - 2 sets - 10 reps - 5 seconds hold - Supine Heel Slide with Strap  - 1-2 x daily - 2 sets - 10 reps - 5 seconds hold - Long Sitting 4 Way Patellar Glide  - 1-2 x daily - 20-30 hold  ASSESSMENT:  CLINICAL IMPRESSION: Patient is a 22 y.o. female referred to outpatient  physical therapy with a medical diagnosis of synovial plica syndrome of left knee, s/p L knee surgery 08/25/2022 who presents with signs and symptoms consistent with L knee pain, swelling, weakness, and dysfunction s/p L knee arthroscopy with limited synovectomy to the medial plica, limited chondroplasty consisting of medial femoral condyle debridement of grade 1 changes, and debridement to hypertrophic fat pad and ligamentum mucosum on 08/25/2022 after history of non-resolving pain following injury 11/25, 2023 in a college level basketball player. Patient presents with significant pain, joint stiffness, ROM, motor control, muscle performance (strength/power/endurance), pain/fear avoidance, knowledge, activity tolerance, work capacity, athletic performance impairments that are limiting ability to complete her usual activities such as walking for a long period of time, bending, dressing, bathing, household responsibiltites, hiking, swimming, biking, jumping on trampoline, climbing, playing college basketball, running, navigating stairs, etc, without difficulty. Patient will benefit from skilled physical therapy intervention to address current body structure impairments and activity limitations to improve  function and work towards goals set in current POC in order to return to prior level of function or maximal functional improvement.   OBJECTIVE IMPAIRMENTS: Abnormal gait, decreased activity tolerance, decreased balance, decreased coordination, decreased endurance, decreased knowledge of condition, decreased knowledge of use of DME, decreased mobility, difficulty walking, decreased ROM, decreased strength, hypomobility, increased edema, impaired perceived functional ability, impaired flexibility, improper body mechanics, and pain.   ACTIVITY LIMITATIONS: carrying, lifting, bending, sitting, standing, squatting, stairs, transfers, bed mobility, bathing, dressing, and locomotion level, running, cutting, athletic performance activities.   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   REHAB POTENTIAL: Good  CLINICAL DECISION MAKING: Stable/uncomplicated  EVALUATION COMPLEXITY: Low   GOALS: Goals reviewed with patient? No  SHORT TERM GOALS: Target date: 09/22/2022  Patient will be independent with initial home exercise program for self-management of symptoms. Baseline: Initial HEP provided at IE (09/08/22); Goal status: INITIAL   LONG TERM GOALS: Target date: 12/01/2022  Patient will be independent with a long-term home exercise program for self-management of symptoms.  Baseline: Initial HEP provided at IE (09/08/22); Goal status: INITIAL  2.  Patient will demonstrate  improved FOTO to equal or greater than 62 by visit #15 to demonstrate improvement in overall condition and self-reported functional ability.  Baseline: 28 (09/08/22); Goal status: INITIAL  3.  Patient will demonstrate L quad extension 80% or more of R quad extension strength on 1RM on knee extension machine to improve her ability to return to basketball. Baseline: L knee extension MMT 3/5 and painful (09/08/22); Goal status: INITIAL  4.  Patient will demonstrate L knee PROM equal or greater than R knee PROM to improve her ability to return to playing college basketball and active hobbies.  Baseline: 5-0-130 with end range pain (09/08/22); Goal status: INITIAL  5.  Patient will report pain with functional activity equal or less than 3/10 to improve her ability to return to running, basketball, stairs, etc.  Baseline: up to 9/10 (09/08/22); Goal status: INITIAL  6.  Patient will demonstrate L LE composite score (ANT, PM, PL) on LOWER QUARTER Y-BALANCE TESTof 95% or greater than R LE to demonstrate improvements in L LE balance, motor control,  and strength needed to return to college level basketball and active hobbies.  Baseline: deferred to later date (09/08/22); Goal status: INITIAL    PLAN:  PT FREQUENCY: 1-2x/week  PT DURATION: 12 weeks  PLANNED INTERVENTIONS: Therapeutic exercises, Therapeutic activity, Neuromuscular re-education, Balance training, Gait training, Patient/Family education, Self Care, Joint mobilization, Stair training, DME instructions, Dry Needling, Electrical stimulation, Spinal mobilization, Cryotherapy, Moist heat, Taping, Manual therapy, and Re-evaluation  PLAN FOR NEXT SESSION: complete baseline testing of R LE for 1RM knee extension and Y balance test when appropriate, update HEP as appropriate, progressive LE/functional ROM, strengthening, proprioceptive, and balance exercises within guidelines of protocol. Education. Manual therapy and dry needling as needed.      Cira Rue, PT, DPT 09/08/2022, 7:23 PM   [facility] Rehab [address] [place], [place] [address] P: [phone] I F: [phone]

## 2022-09-08 ENCOUNTER — Ambulatory Visit: Payer: BC Managed Care – PPO | Attending: Orthopedic Surgery | Admitting: Physical Therapy

## 2022-09-08 ENCOUNTER — Encounter: Payer: Self-pay | Admitting: Physical Therapy

## 2022-09-08 ENCOUNTER — Telehealth: Payer: Self-pay | Admitting: Physical Therapy

## 2022-09-08 DIAGNOSIS — R262 Difficulty in walking, not elsewhere classified: Secondary | ICD-10-CM | POA: Diagnosis present

## 2022-09-08 DIAGNOSIS — M6281 Muscle weakness (generalized): Secondary | ICD-10-CM | POA: Diagnosis present

## 2022-09-08 DIAGNOSIS — M25662 Stiffness of left knee, not elsewhere classified: Secondary | ICD-10-CM

## 2022-09-08 DIAGNOSIS — M25562 Pain in left knee: Secondary | ICD-10-CM

## 2022-09-08 DIAGNOSIS — R609 Edema, unspecified: Secondary | ICD-10-CM

## 2022-09-08 NOTE — Telephone Encounter (Signed)
Called physical therapist (TJ) that patient saw at [facility] for two visits prior to transferring to our clinic (phone [phone]) as requested by TJ/surgeon per patient's mother and herself.  TJ was unavailable to talk but provided another phone number where he can be reached: [phone]. I also left my cell number with him. Was told he might have time to talk around 4pm, but I was unable to call back as I was treating patients at that time. Will re-attempt contact at a later time/date as appropriate.   Luretha Murphy. Ilsa Iha, PT, DPT 09/08/22, 7:26 PM  [facility] Physical & Sports Rehab [address] [place], [place] [address] P: [phone] I F: [phone]

## 2022-09-09 ENCOUNTER — Telehealth: Payer: Self-pay | Admitting: Physical Therapy

## 2022-09-09 NOTE — Telephone Encounter (Addendum)
Attempted to call TJ, physical therapist at [facility], to discuss pateint's care as patient/mother said PT/MD requested. Used phone number provided by TJ's clinic yesterday: [phone]. Left VM.  Luretha Murphy. Ilsa Iha, PT, DPT 09/09/22, 10:01 AM  [facility] Physical & [facility] Rehab [address] [place], [place] [address] P: [phone] I F: [phone]   TJ called back and discussed patient's care with this PT. He said patient had a pretty simple procedure and he was concerned about her fear avoidance and lack of extension when he saw her. He said with her long history of pain he did not expect her to be pain free by her first follow up with MD. He said they wanted to make sure she was progressing to week to week. He advised to contact him if this PT had any questions or wanted to relay information to Dr. Roxanne Mins before a follow up visit or otherwise. He they do not expect this PT to follow the protocol exactly.   Luretha Murphy. Ilsa Iha, PT, DPT 09/09/22, 10:15 AM  [facility] Physical & Sports Rehab [address] [place], [place] [address] P: [phone] I F: [phone]

## 2022-09-10 ENCOUNTER — Ambulatory Visit: Payer: BC Managed Care – PPO | Admitting: Physical Therapy

## 2022-09-10 ENCOUNTER — Encounter: Payer: Self-pay | Admitting: Physical Therapy

## 2022-09-10 DIAGNOSIS — R262 Difficulty in walking, not elsewhere classified: Secondary | ICD-10-CM

## 2022-09-10 DIAGNOSIS — M25562 Pain in left knee: Secondary | ICD-10-CM

## 2022-09-10 DIAGNOSIS — R609 Edema, unspecified: Secondary | ICD-10-CM

## 2022-09-10 DIAGNOSIS — M6281 Muscle weakness (generalized): Secondary | ICD-10-CM

## 2022-09-10 DIAGNOSIS — M25662 Stiffness of left knee, not elsewhere classified: Secondary | ICD-10-CM

## 2022-09-10 NOTE — Therapy (Signed)
OUTPATIENT PHYSICAL THERAPY TREATMENT NOTE   Patient Name: Toni Roberts MRN: [id number] DOB:2001-04-07, 22 y.o., female Today's Date: 09/10/2022  END OF SESSION:  PT End of Session - 09/10/22 1202     Visit Number 2    Number of Visits 24    Date for PT Re-Evaluation 12/01/22    Authorization Type [employer] reporting period from 09/08/2022    Authorization Time Period VL: 30 PT/OT/Chiro    Authorization - Visit Number 2    Authorization - Number of Visits 30    Progress Note Due on Visit 10    PT Start Time 1116    PT Stop Time 1210    PT Time Calculation (min) 54 min    Activity Tolerance Patient limited by pain    Behavior During Therapy [facility] for tasks assessed/performed              Past Medical History:  Diagnosis Date   Allergy    seasonal   Fracture, finger 2014   right hand   Heart murmur    recently dx by Dr Marney Setting mom said this is the 1st time a doctor has told her this-pt asymptomatic per Madolyn Frieze, pts mom   Torn ligament 2017   left foot   Past Surgical History:  Procedure Laterality Date   INGUINAL HERNIA REPAIR Right 07/01/2016   Primary repair right indirect inguinal hernia. Surgeon: Earline Mayotte, MD;  Location: [facility] ORS;  Service: General;  Laterality: Right;   NO PAST SURGERIES     Patient Active Problem List   Diagnosis Date Noted   Moderate episode of recurrent major depressive disorder (HCC) 12/22/2019   Anxiety disorder 08/09/2019   Attention deficit hyperactivity disorder (ADHD), combined type 08/09/2019   Other insomnia 08/09/2019   Non-recurrent unilateral inguinal hernia without obstruction or gangrene 06/11/2016   Peroneal tendon injury 08/30/2015   Left ankle swelling 08/30/2015   Plica syndrome of left knee 09/26/2014    PCP: Charlton Amor, MD   REFERRING PROVIDER: Berkley Harvey, MD   REFERRING DIAG: synovial plica syndrome of left knee, 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  Patient denies hx of cancer, stroke, seizures, lung problems, heart problems, diabetes, unexplained weight loss, unexplained changes in bowel  or bladder problems, unexplained stumbling or dropping things, osteoporosis, and spinal surgery  SUBJECTIVE:   SUBJECTIVE STATEMENT: Patient arrives with no assistive device. She states one of the pieces of tape came off. She did her HEP yesterday and they were okay.  She states she has been on the ice machine this morning prior to PT. She  states she continues to have pain all the time.   PAIN:  Are you having pain? Yes NPRS scale:  6/10 "minimal" Pain location: left knee  PRECAUTIONS: WBAT with axillary crutches as needed 0-6 weeks post op, see protocol in chart for more detailed precautions.   WEIGHT BEARING RESTRICTIONS: Yes WBAT with axillary crutches as needed 0-6 weeks post op  PATIENT GOALS: "get back to playing college basketball"   OBJECTIVE  FUNCTIONAL TESTS R knee extension 1RM on knee extension machine: 45# Y Balance test:  right anterior: 66, 74, 76 average 72 right posteromedial: 117, 112, 118, average 116 right posterolateral: 110, 106, 114, average 110 Average composite score: 298   TODAY'S TREATMENT:  Therapeutic exercise: to centralize symptoms and improve ROM, strength, muscular endurance, and activity tolerance required for successful completion of functional activities.  - Recumbent Bike level 1, seat position 13. For improved lower extremity ROM, muscular endurance, and activity tolerance; and to induce the analgesic effect of aerobic exercise, stimulate joint nutrition, and prepare body structures and systems for following interventions. x 5  minutes. Required assistance to set up machine. RPM to low for machine to turn on but making full revolutions.  - right knee extension on OMEGA machine, 1x10 at 15# - 1 RM testing for R knee extension on OMEGA machine - wall squat up to 60 degrees flexion, 3x10 seconds, 2x30 seconds with 1.5 min rest between sets.  - Education on HEP including handout   Modality: for improved quad activation Seated RUSSIAN NMES estim to right quad, seated at knee extension machine with knee at 60 degrees flexion and stabilized to not move, CC, 10/50 cycle, 50 bps, 50% duty cycle, 2 second ramp, anti-fatigue off. Intensity up to 75-65 mA (reduced for improved tolerance)  Skin WFL before and after application. Skin cleaned with soap and water prior to application of 4x2 inch  oval electrodes over distal quad and proximal motor point. MVIC tested just prior to application and found to be between 5#, unable able to reach 50% MVIC at 65 mA (did move 5# slightly at 75 mA but reporting increased joint pain) during NMES. Patient did not contribute volitional contraction during delivery of stimulation. 15 minutes. Patient tolerated well. Unattended/intermittent attendance by PT after set up and before removal. Pt required multimodal cuing for proper technique and to facilitate improved neuromuscular control, strength, range of motion, and functional ability resulting in improved performance and form.  PATIENT EDUCATION:  Education details: Exercise purpose/form. Self management techniques. Estim.  Reviewed cancelation/no-show policy with patient and confirmed patient has correct phone number for clinic; patient verbalized understanding (09/08/22). Person educated: Patient Education method: Explanation, Demonstration, Tactile cues, and Verbal cues Education comprehension: verbalized understanding, returned demonstration, verbal cues required, tactile cues required, and needs further education  HOME EXERCISE PROGRAM: Access Code: [email or web address] URL: [email or web address] Date: 09/10/2022 Prepared by: Norton Blizzard  Exercises - Supine Quad Set  - 1-2 x daily - 2 sets - 10 reps - 5 seconds hold - Active Straight Leg Raise with Quad Set  - 1-2 x daily - 2 sets - 10 reps - 5 seconds hold - Supine Heel Slide  with Strap  - 1-2 x daily - 2 sets - 10 reps - 5 seconds hold - Long Sitting 4 Way Patellar Glide  - 1-2 x daily - 20-30 hold - Wall Quarter Squat  - 2 x daily - 5 reps - 30 seconds hold - 1.5 minutes rest between sets  ASSESSMENT:  CLINICAL IMPRESSION: Patient arrives with similar pain complaint. Today's session continued baseline functional testing and progressed HEP to include isometric wall squat. She also started NMES to improve L quad activation as recommended  in [facility] for knee surgeries. Patient was able to tolerate session with pain and was advised to apply ice at home. Patient would benefit from continued management of limiting condition by skilled physical therapist to address remaining impairments and functional limitations to work towards stated goals and return to PLOF or maximal functional independence.   From initial PT evaluation 09/08/2022:  Patient is a 22 y.o. 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 Patient presents with significant pain, joint stiffness, ROM, motor control, muscle performance (strength/power/endurance), pain/fear avoidance, knowledge, activity tolerance, work capacity, athletic performance impairments that are limiting ability to complete her usual activities such as walking for a long period of time, bending, dressing, bathing, household responsibiltites, hiking, swimming, biking, jumping on trampoline, climbing, playing college basketball, running, navigating stairs, etc, without difficulty. Patient will benefit from skilled physical therapy intervention to address current body structure impairments and activity limitations to improve function and work towards goals set in current POC in order to return to prior level of function or maximal functional improvement.   OBJECTIVE IMPAIRMENTS: Abnormal gait, decreased activity tolerance, decreased balance, decreased coordination, decreased endurance, decreased  knowledge of condition, decreased knowledge of use of DME, decreased mobility, difficulty walking, decreased ROM, decreased strength, hypomobility, increased edema, impaired perceived functional ability, impaired flexibility, improper body mechanics, and pain.   ACTIVITY LIMITATIONS: carrying, lifting, bending, sitting, standing, squatting, stairs, transfers, bed mobility, bathing, dressing, and locomotion level, running, cutting, athletic performance activities.   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   REHAB POTENTIAL: Good  CLINICAL DECISION MAKING: Stable/uncomplicated  EVALUATION COMPLEXITY: Low   GOALS: Goals reviewed with patient? No  SHORT TERM GOALS: Target date: 09/22/2022  Patient will be independent with initial home exercise program for self-management of symptoms. Baseline: Initial HEP provided at IE (09/08/22); Goal status: In-progress   LONG TERM GOALS: Target date: 12/01/2022  Patient will be independent with a long-term home exercise program for self-management of symptoms.  Baseline: Initial HEP provided at IE (09/08/22); Goal status: In-progress  2.  Patient will demonstrate improved FOTO to equal or greater than 62 by visit #15 to demonstrate improvement in overall condition and  self-reported functional ability.  Baseline: 28 (09/08/22); Goal status: In-progress  3.  Patient will demonstrate L quad extension 80% or more of R quad  extension strength on 1RM on knee extension machine to improve her ability to return to basketball. Baseline: L knee extension MMT 3/5 and painful (09/08/22); Goal status: In-progress  4.  Patient will demonstrate L knee PROM equal or greater than R knee PROM to improve her ability to return to playing college basketball and active hobbies.  Baseline: 5-0-130 with end range pain (09/08/22); Goal status: In-progress  5.  Patient will report pain with functional activity equal or less than 3/10 to improve her ability to return to running, basketball, stairs, etc.  Baseline: up to 9/10 (09/08/22); Goal status: In-progress  6.  Patient will demonstrate L LE composite score (ANT, PM, PL) on LOWER QUARTER Y-BALANCE TESTof 95% or greater than R LE to demonstrate improvements in L LE balance, motor control, and strength needed to return to college level basketball and active hobbies.  Baseline: deferred to later date (09/08/22); Goal status: In-progress    PLAN:  PT FREQUENCY: 1-2x/week  PT DURATION: 12 weeks  PLANNED INTERVENTIONS: Therapeutic exercises, Therapeutic activity, Neuromuscular re-education, Balance training, Gait training, Patient/Family education, Self Care, Joint mobilization, Stair training, DME instructions, Dry Needling, Electrical stimulation, Spinal mobilization, Cryotherapy, Moist heat, Taping, Manual therapy, and Re-evaluation  PLAN FOR NEXT SESSION: , update HEP as appropriate, progressive LE/functional ROM, strengthening, proprioceptive, and balance exercises within guidelines of protocol. Education. Manual therapy and dry needling as needed.     Cira Rue, PT, DPT 09/10/2022, 12:12 PM   [facility] [address] [place], [place] [address] P: [phone] I F: [phone]

## 2022-09-16 ENCOUNTER — Encounter: Payer: Self-pay | Admitting: Physical Therapy

## 2022-09-16 ENCOUNTER — Ambulatory Visit: Payer: BC Managed Care – PPO | Admitting: Physical Therapy

## 2022-09-16 DIAGNOSIS — M6281 Muscle weakness (generalized): Secondary | ICD-10-CM

## 2022-09-16 DIAGNOSIS — M25562 Pain in left knee: Secondary | ICD-10-CM

## 2022-09-16 DIAGNOSIS — M25662 Stiffness of left knee, not elsewhere classified: Secondary | ICD-10-CM

## 2022-09-16 DIAGNOSIS — R262 Difficulty in walking, not elsewhere classified: Secondary | ICD-10-CM

## 2022-09-16 DIAGNOSIS — R609 Edema, unspecified: Secondary | ICD-10-CM

## 2022-09-16 NOTE — Therapy (Signed)
OUTPATIENT PHYSICAL THERAPY TREATMENT NOTE   Patient Name: Toni Roberts MRN: [id number] DOB:2000-08-05, 22 y.o., female Today's Date: 09/16/2022  END OF SESSION:  PT End of Session - 09/16/22 1037     Visit Number 3    Number of Visits 24    Date for PT Re-Evaluation 12/01/22    Authorization Type [employer] reporting period from 09/08/2022    Authorization Time Period VL: 30 PT/OT/Chiro    Authorization - Visit Number 2    Authorization - Number of Visits 30    Progress Note Due on Visit 10    PT Start Time 1036    PT Stop Time 1138    PT Time Calculation (min) 62 min    Activity Tolerance Patient limited by pain    Behavior During Therapy [facility] for tasks assessed/performed              Past Medical History:  Diagnosis Date   Allergy    seasonal   Fracture, finger 2014   right hand   Heart murmur    recently dx by Dr Marney Setting mom said this is the 1st time a doctor has told her this-pt asymptomatic per Toni Roberts, pts mom   Torn ligament 2017   left foot   Past Surgical History:  Procedure Laterality Date   INGUINAL HERNIA REPAIR Right 07/01/2016   Primary repair right indirect inguinal hernia. Surgeon: Earline Mayotte, MD;  Location: [facility] ORS;  Service: General;  Laterality: Right;   NO PAST SURGERIES     Patient Active Problem List   Diagnosis Date Noted   Moderate episode of recurrent major depressive disorder (HCC) 12/22/2019   Anxiety disorder 08/09/2019   Attention deficit hyperactivity disorder (ADHD), combined type 08/09/2019   Other insomnia 08/09/2019   Non-recurrent unilateral inguinal hernia without obstruction or gangrene 06/11/2016   Peroneal tendon injury 08/30/2015   Left ankle swelling 08/30/2015   Plica syndrome of left knee 09/26/2014    PCP: Charlton Amor, MD   REFERRING PROVIDER: Berkley Harvey, MD   REFERRING DIAG: synovial plica syndrome of left knee, 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  Patient denies hx of cancer, stroke, seizures, lung problems, heart problems, diabetes, unexplained weight loss, unexplained changes in bowel  or bladder problems, unexplained stumbling or dropping things, osteoporosis, and spinal surgery  SUBJECTIVE:   SUBJECTIVE STATEMENT: Patient arrives with no assistive device. She states she had increased soreness in her L knee for 1.5 days after last PT session. She states HEP is going well. She just woke up and has "a little" pain over her  left knee. She states the swelling is going well. She has stopped using her crutch. She had no problems with her skin after using the estim pads last PT session.   PAIN:  Are you having pain? Yes NPRS scale:  5/10 "a little" Pain location: left knee  PRECAUTIONS: WBAT with axillary crutches as needed 0-6 weeks post op, see protocol in chart for more detailed precautions.   WEIGHT BEARING RESTRICTIONS: Yes WBAT with axillary crutches as needed 0-6 weeks post op  PATIENT GOALS: "get back to playing college basketball"   OBJECTIVE  TODAY'S TREATMENT:  Therapeutic exercise: to centralize symptoms and improve ROM, strength, muscular endurance, and activity tolerance required for successful completion of functional activities.  - Recumbent Bike level 1, seat position 13. For improved lower extremity ROM, muscular endurance, and activity tolerance; and to induce the analgesic effect of aerobic exercise, stimulate joint nutrition, and prepare body structures and systems for following interventions. x 5  minutes. Required assistance to set up machine. RPM increased for machine to turn on.   - seated LAQ 3x10 L LE with 4#AW, 2x10 with 10#AW on R LE.  - hooklying bridge, 3x10 - supine SAQ 3x10 L LE with 4#AW, 2x10 with 10#AW on R LE.  - step up to 8 inch step leading up and down with L LE, 3x10.  - standing heel taps off edge of step standing on R LE, 8 inch step, 2x10. - heel raises with forefeet on 2x4 board, 2x10 double leg stance.   - double leg balance on flat side of bosu ball: 3x60 seconds while performing "around the worlds" with basketball.  - standing hamstring curls with B UE support, 3x10 L LE with 4#AW, 2x10 with 10#AW on R LE.   Modality: for improved quad activation Seated RUSSIAN NMES estim to right quad, seated at knee extension machine, seat position 2, with knee at 60 degrees flexion and stabilized to not move, CC, 10/50 cycle, 50 bps, 50% duty cycle, 2 second ramp, anti-fatigue  off. Intensity up to 69 mA   Skin WFL before and after application. Skin cleaned with soap and water prior to application of 4x2 inch oval electrodes over distal quad and proximal motor point. MVIC tested just prior to application and found to be between 15#, able to reach 50% MVIC at 69 mA (no joint pain) during NMES. Patient did not contribute volitional contraction during delivery of stimulation. 15 minutes. Patient tolerated well. Unattended/intermittent attendance by PT after set up and before removal. Pt required multimodal cuing for proper technique and to facilitate improved neuromuscular control, strength, range of motion, and functional ability resulting in improved performance and form.  PATIENT EDUCATION:  Education details: Exercise purpose/form. Self management techniques. Estim.  Reviewed cancelation/no-show policy with patient and confirmed patient has correct phone number for clinic; patient verbalized understanding (09/08/22). Person educated: Patient Education method: Explanation, Demonstration, Tactile cues, and Verbal cues Education comprehension: verbalized understanding, returned demonstration, verbal cues required, tactile cues required, and needs further education  HOME EXERCISE PROGRAM: Access Code: [email or web address] URL: [email or web address] Date: 09/16/2022 Prepared by: Norton Blizzard  Exercises - Supine Quad Set  - 1-2 x  daily - 2 sets - 10 reps - 5 seconds hold - Active Straight Leg Raise with Quad Set  - 1-2 x daily - 2 sets - 10 reps - 5 seconds hold - Supine Heel Slide with Strap  - 1-2 x daily - 2 sets - 10 reps - 5 seconds hold - Long Sitting 4 Way Patellar Glide  - 1-2 x daily - 20-30 hold - Wall Quarter Squat  - 2 x daily - 5 reps - 30 seconds hold - 1.5 minutes rest between sets - Bridge on Heels  - 3-5 x weekly - 3 sets - 10 reps - Step Up  - 3-5 x weekly - 3 sets - 10 reps  ASSESSMENT:  CLINICAL IMPRESSION: Patient arrives with slightly decreased  pain complaint and report of good participation in HEP. She demonstrated improved quad activation today and was able to progress to more quad strengthening exercises. Plan to update HEP at next session. She was able to move 15# with L single leg knee extension on extension machine instead of being unable to move 5# last visit. She continued to tolerate NMES with difficulty but was alble to get at least 50% MVIC. Patient would benefit from continued management of limiting condition by skilled physical therapist to address remaining impairments and functional limitations to work towards stated goals and return to PLOF or maximal functional independence.   From initial PT evaluation 09/08/2022:  Patient is a 22 y.o. 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 Patient presents with significant pain, joint stiffness, ROM, motor control, muscle performance (strength/power/endurance), pain/fear avoidance, knowledge, activity tolerance, work capacity, athletic performance impairments that are limiting ability to complete her usual activities such as walking for a long period of time, bending, dressing, bathing, household responsibiltites, hiking, swimming, biking, jumping on trampoline, climbing, playing college basketball, running, navigating stairs, etc, without difficulty. Patient will benefit from skilled physical therapy intervention to address current body structure impairments and  activity limitations to improve function and work towards goals set in current POC in order to return to prior level of function or maximal functional improvement.   OBJECTIVE IMPAIRMENTS: Abnormal gait, decreased activity tolerance, decreased balance, decreased coordination, decreased endurance, decreased knowledge of condition, decreased knowledge of use of DME, decreased mobility, difficulty walking, decreased ROM, decreased strength, hypomobility, increased edema, impaired perceived functional ability, impaired flexibility, improper body mechanics, and pain.   ACTIVITY LIMITATIONS: carrying, lifting, bending, sitting, standing, squatting, stairs, transfers, bed mobility, bathing, dressing, and locomotion level, running, cutting, athletic performance activities.   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   REHAB POTENTIAL: Good  CLINICAL DECISION MAKING: Stable/uncomplicated  EVALUATION COMPLEXITY: Low   GOALS: Goals reviewed  with patient? No  SHORT TERM GOALS: Target date: 09/22/2022  Patient will be independent with initial home exercise program for self-management of symptoms. Baseline: Initial HEP provided at IE (09/08/22); Goal status:  MET   LONG TERM GOALS: Target date: 12/01/2022  Patient will be independent with a long-term home exercise program for self-management of symptoms.  Baseline: Initial HEP provided at IE (09/08/22); Goal status:  In-progress  2.  Patient will demonstrate improved FOTO to equal or greater than 62 by visit #15 to demonstrate improvement in overall condition and self-reported functional ability.  Baseline: 28 (09/08/22); Goal status: In-progress  3.  Patient will demonstrate L quad extension 80% or more of R quad extension strength on 1RM on knee extension machine to improve her ability to return to basketball. Baseline: L knee extension MMT 3/5 and painful (09/08/22); Goal status: In-progress  4.  Patient will demonstrate L knee PROM equal or greater than R knee PROM to improve her ability to return to playing college basketball and active hobbies.  Baseline: 5-0-130 with end range pain (09/08/22); Goal status: In-progress  5.  Patient will report pain with functional activity equal or less than 3/10 to improve her ability to return to running, basketball, stairs, etc.  Baseline: up to 9/10 (09/08/22); Goal status: In-progress  6.  Patient will demonstrate L LE composite score (ANT, PM, PL) on LOWER QUARTER Y-BALANCE TESTof 95% or greater than R LE to demonstrate improvements in L LE balance, motor control, and strength needed to return to college level basketball and active hobbies.  Baseline: deferred to later date (09/08/22); Goal status: In-progress    PLAN:  PT FREQUENCY: 1-2x/week  PT DURATION: 12 weeks  PLANNED INTERVENTIONS: Therapeutic exercises, Therapeutic activity, Neuromuscular re-education, Balance training, Gait training, Patient/Family education, Self Care, Joint mobilization, Stair training, DME instructions, Dry Needling, Electrical stimulation, Spinal mobilization, Cryotherapy, Moist heat, Taping, Manual therapy, and Re-evaluation  PLAN FOR NEXT SESSION: , update HEP as appropriate, progressive LE/functional ROM, strengthening, proprioceptive, and balance exercises within guidelines of protocol. Education. Manual therapy and dry needling as needed.     Cira Rue, PT,  DPT 09/16/2022, 11:34 AM   [facility] [address] [place], [place] [address] P: [phone] I F: [phone]

## 2022-09-18 ENCOUNTER — Encounter: Payer: Self-pay | Admitting: Physical Therapy

## 2022-09-18 ENCOUNTER — Ambulatory Visit: Payer: BC Managed Care – PPO | Admitting: Physical Therapy

## 2022-09-18 DIAGNOSIS — M25662 Stiffness of left knee, not elsewhere classified: Secondary | ICD-10-CM

## 2022-09-18 DIAGNOSIS — R609 Edema, unspecified: Secondary | ICD-10-CM

## 2022-09-18 DIAGNOSIS — M6281 Muscle weakness (generalized): Secondary | ICD-10-CM

## 2022-09-18 DIAGNOSIS — M25562 Pain in left knee: Secondary | ICD-10-CM | POA: Diagnosis not present

## 2022-09-18 NOTE — Therapy (Signed)
OUTPATIENT PHYSICAL THERAPY TREATMENT NOTE   Patient Name: Toni Roberts MRN: [id number] DOB:2001-03-30, 22 y.o., female Today's Date: 09/18/2022  END OF SESSION:  PT End of Session - 09/18/22 1437     Visit Number 4    Number of Visits 24    Date for PT Re-Evaluation 12/01/22    Authorization Type [employer] reporting period from 09/08/2022    Authorization Time Period VL: 30 PT/OT/Chiro    Authorization - Visit Number 3    Authorization - Number of Visits 30    Progress Note Due on Visit 10    PT Start Time 1434    PT Stop Time 1534    PT Time Calculation (min) 60 min    Activity Tolerance Patient limited by pain;Patient tolerated treatment well;Patient limited by fatigue    Behavior During Therapy [facility] for tasks assessed/performed               Past Medical History:  Diagnosis Date   Allergy    seasonal   Fracture, finger 2014   right hand   Heart murmur    recently dx by Dr Marney Setting mom said this is the 1st time a doctor has told her this-pt asymptomatic per Madolyn Frieze, pts mom   Torn ligament 2017   left foot   Past Surgical History:  Procedure Laterality Date   INGUINAL HERNIA REPAIR Right 07/01/2016   Primary repair right indirect inguinal hernia. Surgeon: Earline Mayotte, MD;  Location: [facility] ORS;  Service: General;  Laterality: Right;   NO PAST SURGERIES     Patient Active Problem List   Diagnosis Date Noted   Moderate episode of recurrent major depressive disorder (HCC) 12/22/2019   Anxiety disorder 08/09/2019   Attention deficit hyperactivity disorder (ADHD), combined type 08/09/2019   Other insomnia 08/09/2019   Non-recurrent unilateral inguinal hernia without obstruction or gangrene 06/11/2016   Peroneal tendon injury 08/30/2015   Left ankle swelling 08/30/2015   Plica syndrome of left knee 09/26/2014    PCP: Charlton Amor, MD   REFERRING PROVIDER: Berkley Harvey, MD   REFERRING DIAG: synovial plica syndrome  of left knee, 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  Patient denies hx of cancer, stroke, seizures, lung problems, heart problems, diabetes, unexplained weight loss, unexplained changes in bowel  or bladder problems, unexplained stumbling or dropping things, osteoporosis, and spinal surgery  SUBJECTIVE:   SUBJECTIVE STATEMENT: Patient arrives with no assistive device. She states she was sore after last PT session, but not too bad. She has no pain at rest but some pain with extension today.   PAIN:  Are you having pain?  Yes NPRS scale:  5/10 "minimum"  Pain location: left knee when straightening it  PRECAUTIONS: WBAT with axillary crutches as needed 0-6 weeks post op, see protocol in chart for more detailed precautions.   WEIGHT BEARING RESTRICTIONS: Yes WBAT with axillary crutches as needed 0-6 weeks post op  PATIENT GOALS: "get back to playing college basketball"   OBJECTIVE  TODAY'S TREATMENT:  Therapeutic exercise: to centralize symptoms and improve ROM, strength, muscular endurance, and activity tolerance required for successful completion of functional activities.  - Recumbent Bike level 1, seat position 13. For improved lower extremity ROM, muscular endurance, and activity tolerance; and to induce the analgesic effect of aerobic exercise, stimulate joint nutrition, and prepare body structures and systems for following interventions. x 5  minutes. Required assistance to set up machine. RPM goal 70 RPM.  - seated LAQ 3x10 L LE with 7.5#AW, 2x10 with 10#AW on R LE.  - hooklying bridge, 3x10 with a 10#DB over each side of pelvis. Able to do a few reps of L single leg eccentric lowers, but discontinued due to pain.  - supine SAQ 3x10 L LE with 7.5#AW, 2x10 with 10#AW on R LE. - standing heel taps off edge of 8 inch step 2x10 standing on L, 2x10 standing on the R. (Patient self selected heel tap instead of step up).  - forwards step up to 8 inch step, 1x10 with L LE.  - single leg heel raises with forefeet on 2x4 board, 2x10 each side (2nd set single leg for eccentric portion only with L LE due to pain).   - double leg balance on flat side of bosu ball: 3x60 seconds while performing "around the worlds" with basketball.  - standing hamstring curls with B UE support, 3x10 L LE with 7.5#AW, 2x10 with 10#AW on R LE.   Modality: for improved quad activation Seated RUSSIAN NMES estim to right quad, seated at knee extension machine, seat position 2, with knee at 60 degrees flexion and stabilized to not move, CC,  10/50 cycle, 50 bps, 50% duty cycle, 2 second ramp, anti-fatigue off. Intensity up to 69 mA   Skin WFL before and after application. Skin cleaned with soap and water prior to application of 4x2 inch oval electrodes over distal quad and proximal motor point. MVIC tested just prior to application and found to be between 15 and 20#, not able to reach 50% MVIC at 80 mA (no joint pain) during NMES despite moving the pads twice. Patient did not contribute volitional contraction during delivery of stimulation. 15 minutes. Patient tolerated well. Unattended/intermittent attendance by PT after set up and before removal. Pt required multimodal cuing for proper technique and to facilitate improved neuromuscular control, strength, range of motion, and functional ability resulting in improved performance and form.  PATIENT EDUCATION:  Education details: Exercise purpose/form. Self management techniques. Estim.  Reviewed cancelation/no-show policy with patient and confirmed patient has correct phone number for clinic; patient verbalized understanding (09/08/22). Person educated: Patient Education method: Explanation, Demonstration, Tactile cues, and Verbal cues Education comprehension: verbalized understanding, returned demonstration, verbal cues required, tactile cues required, and needs further education  HOME EXERCISE PROGRAM: Access Code: [email or web address]  URL: [email or web address] Date: 09/16/2022 Prepared by: Norton Blizzard  Exercises - Supine Quad Set  - 1-2 x daily - 2 sets - 10 reps - 5 seconds hold - Active Straight Leg Raise with Quad Set  - 1-2 x daily - 2 sets - 10 reps - 5 seconds hold - Supine Heel Slide with Strap  - 1-2 x daily - 2 sets - 10 reps - 5 seconds hold - Long Sitting 4 Way Patellar Glide  - 1-2 x daily - 20-30 hold - Wall Quarter Squat  - 2 x daily - 5 reps - 30 seconds hold - 1.5 minutes rest between sets - Bridge on Heels  - 3-5 x weekly - 3 sets - 10 reps - Step Up  - 3-5 x  weekly - 3 sets - 10 reps  ASSESSMENT:  CLINICAL IMPRESSION: Patient arrives with improved knee pain and confidence. She was again able to progress L knee and LE exercises today with mild increase in pain by end of session. Was not able to get as good a contraction on NMES this session. Patient would benefit from continued management of limiting condition by skilled physical therapist to address remaining impairments and functional limitations to work towards stated goals and return to PLOF or maximal functional independence.   From initial PT evaluation 09/08/2022:  Patient is a 22 y.o. 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 Patient presents with significant pain, joint stiffness, ROM, motor control, muscle performance (strength/power/endurance), pain/fear avoidance, knowledge, activity tolerance, work capacity, athletic performance impairments that are limiting ability to complete her usual activities such as walking for a long period of time, bending, dressing, bathing, household responsibiltites, hiking, swimming, biking, jumping on trampoline, climbing, playing college basketball, running, navigating stairs, etc, without difficulty. Patient will benefit from skilled physical therapy intervention to address current body structure impairments and activity limitations to improve function and work towards goals set in current POC in order to return to prior level of function  or maximal functional improvement.   OBJECTIVE IMPAIRMENTS: Abnormal gait, decreased activity tolerance, decreased balance, decreased coordination, decreased endurance, decreased knowledge of condition, decreased knowledge of use of DME, decreased mobility, difficulty walking, decreased ROM, decreased strength, hypomobility, increased edema, impaired perceived functional ability, impaired flexibility, improper body mechanics, and pain.   ACTIVITY LIMITATIONS: carrying, lifting, bending, sitting, standing, squatting, stairs, transfers, bed mobility, bathing, dressing, and locomotion level, running, cutting, athletic performance activities.   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   REHAB POTENTIAL: Good  CLINICAL DECISION MAKING: Stable/uncomplicated  EVALUATION COMPLEXITY: Low   GOALS: Goals reviewed with patient? No  SHORT TERM GOALS: Target date: 09/22/2022  Patient will be independent with initial  home exercise program for self-management of symptoms. Baseline: Initial HEP provided at IE (09/08/22); Goal status:  MET   LONG TERM GOALS: Target date: 12/01/2022  Patient will be independent with a long-term home exercise program for self-management of symptoms.  Baseline: Initial HEP provided at IE (09/08/22); Goal status: In-progress  2.  Patient will demonstrate improved FOTO to equal or greater than 62 by visit #15 to demonstrate improvement in overall  condition and self-reported functional ability.  Baseline: 28 (09/08/22); Goal status: In-progress  3.  Patient will demonstrate L quad extension 80% or more of R quad extension strength on 1RM on knee extension machine to improve her ability to return to basketball. Baseline: L knee extension MMT 3/5 and painful (09/08/22); Goal status: In-progress  4.  Patient will demonstrate L knee PROM equal or greater than R knee PROM to improve her ability to return to playing college basketball and active hobbies.  Baseline: 5-0-130 with end range pain (09/08/22); Goal status: In-progress  5.  Patient will report pain with functional activity equal or less than 3/10 to improve her ability to return to running, basketball, stairs, etc.  Baseline: up to 9/10 (09/08/22); Goal status: In-progress  6.  Patient will demonstrate L LE composite score (ANT, PM, PL) on LOWER QUARTER Y-BALANCE TESTof 95% or greater than R LE to demonstrate improvements in L LE balance, motor control, and strength needed to return to college level basketball and active hobbies.  Baseline: deferred to later date (09/08/22); Goal status: In-progress    PLAN:  PT FREQUENCY: 1-2x/week  PT DURATION: 12 weeks  PLANNED INTERVENTIONS: Therapeutic exercises, Therapeutic activity, Neuromuscular re-education, Balance training, Gait training, Patient/Family education, Self Care, Joint mobilization, Stair training, DME instructions, Dry Needling, Electrical stimulation, Spinal mobilization, Cryotherapy, Moist heat, Taping, Manual therapy, and Re-evaluation  PLAN FOR NEXT SESSION: , update HEP as appropriate, progressive LE/functional ROM, strengthening, proprioceptive, and balance exercises within guidelines of protocol. Education. Manual therapy and dry needling as needed.     Cira Rue, PT, DPT 09/18/2022, 3:36 PM   [facility] [employer] [facility] [address] [place], [place] [address] P: [phone] I F:  [phone]

## 2022-09-23 ENCOUNTER — Ambulatory Visit: Payer: BC Managed Care – PPO | Admitting: Physical Therapy

## 2022-09-23 ENCOUNTER — Encounter: Payer: Self-pay | Admitting: Physical Therapy

## 2022-09-23 DIAGNOSIS — M25562 Pain in left knee: Secondary | ICD-10-CM

## 2022-09-23 DIAGNOSIS — R262 Difficulty in walking, not elsewhere classified: Secondary | ICD-10-CM

## 2022-09-23 DIAGNOSIS — M25662 Stiffness of left knee, not elsewhere classified: Secondary | ICD-10-CM

## 2022-09-23 DIAGNOSIS — M6281 Muscle weakness (generalized): Secondary | ICD-10-CM

## 2022-09-23 DIAGNOSIS — R609 Edema, unspecified: Secondary | ICD-10-CM

## 2022-09-23 NOTE — Therapy (Signed)
OUTPATIENT PHYSICAL THERAPY TREATMENT NOTE   Patient Name: Toni Roberts MRN: [id number] DOB:[birth year], 22 y.o., female Today's Date: 09/23/2022  END OF SESSION:  PT End of Session - 09/23/22 1412     Visit Number 5    Number of Visits 24    Date for PT Re-Evaluation 12/01/22    Authorization Type [employer] reporting period from 09/08/2022    Authorization Time Period VL: 30 PT/OT/Chiro    Authorization - Visit Number 4    Authorization - Number of Visits 30    Progress Note Due on Visit 10    PT Start Time 1352    PT Stop Time 1454    PT Time Calculation (min) 62 min    Activity Tolerance Patient limited by pain;Patient tolerated treatment well;Patient limited by fatigue    Behavior During Therapy [facility] for tasks assessed/performed                Past Medical History:  Diagnosis Date   Allergy    seasonal   Fracture, finger 2014   right hand   Heart murmur    recently dx by Dr Marney Setting mom said this is the 1st time a doctor has told her this-pt asymptomatic per Madolyn Frieze, pts mom   Torn ligament 2017   left foot   Past Surgical History:  Procedure Laterality Date   INGUINAL HERNIA REPAIR Right 07/01/2016   Primary repair right indirect inguinal hernia. Surgeon: Earline Mayotte, MD;  Location: [facility] ORS;  Service: General;  Laterality: Right;   NO PAST SURGERIES     Patient Active Problem List   Diagnosis Date Noted   Moderate episode of recurrent major depressive disorder (HCC) 12/22/2019   Anxiety disorder 08/09/2019   Attention deficit hyperactivity disorder (ADHD), combined type 08/09/2019   Other insomnia 08/09/2019   Non-recurrent unilateral inguinal hernia without obstruction or gangrene 06/11/2016   Peroneal tendon injury 08/30/2015   Left ankle swelling 08/30/2015   Plica syndrome of left knee 09/26/2014    PCP: Charlton Amor, MD   REFERRING PROVIDER: Berkley Harvey, MD   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  Patient denies hx of cancer, stroke, seizures, lung problems, heart problems,  diabetes, unexplained weight loss, unexplained changes in bowel or bladder problems, unexplained stumbling or dropping things, osteoporosis, and spinal surgery  SUBJECTIVE:   SUBJECTIVE STATEMENT: Patient arrives reporting increased pain since jogging around her apartment complex about a mile on Sunday. She states she has been out of commission  since then. She states she does not remember getting a lot of swelling but her knee was hurting a lot. She states the pain has come down since then but is still elevated compared to prior to jogging. She was a little sore through the day after last PT session but not too bad.   PAIN:  Are you having pain? Yes NPRS scale:  7/10 "minimum"  Pain location: entire left knee  PRECAUTIONS: WBAT with axillary crutches as needed 0-6 weeks post op, see protocol in chart for more detailed precautions.   WEIGHT BEARING RESTRICTIONS: Yes WBAT with axillary crutches as needed 0-6 weeks post op  PATIENT GOALS: "get back to playing college basketball"   OBJECTIVE  SELF-REPORTED FUNCTION FOTO score: 47/100 (knee questionnaire)   TODAY'S TREATMENT:  Therapeutic exercise: to centralize symptoms and improve ROM, strength, muscular endurance, and activity tolerance required for successful completion of functional activities.  - Recumbent Bike level 3, seat position 13. For improved lower extremity ROM, muscular endurance, and activity tolerance; and to induce the analgesic effect of aerobic exercise, stimulate joint nutrition, and prepare body structures and systems for following interventions. x 6  minutes. Required assistance to set up machine.  - seated LAQ 3x10 L LE with 10#AW, 2x10 with 10#AW on R LE.  - hooklying bridge with single leg centric lowers, 3x10 on L and single leg for both phases 2x10 on R. Able to do a few single leg reps both phases on L but discontinued due to pain.  - hooklying eccentric hamstring curl with heels on sliders, a few reps with B LE, one rep with just left (difficult and painful).  - supine SAQ 3x10 L LE with 10#AW, 2x10 with 10#AW on R LE. - L SLS with R toe reaches front, posterolateral, and posteromedial, 2x5 reps.  - SLS on flat side of bosu with basketball movements as able, 3x60 seconds L side and 2x60 seconds R side.   Modality: for improved quad activation Seated  RUSSIAN NMES estim to right quad, seated at knee extension machine, seat position 2, with knee at 60 degrees flexion and stabilized to not move, CC, 10/50 cycle, 50 bps, 50% duty cycle, 2 second ramp, anti-fatigue off. Intensity up to 83 mA   Skin WFL before and after application. Skin cleaned with soap and water prior to application of 4x2 inch oval electrodes over distal quad and proximal motor point. MVIC tested just prior to application and found to be between 30#, able to reach 50% MVIC during NMES. Patient did not contribute volitional contraction during delivery of stimulation. 15 minutes. Patient tolerated well. Unattended/intermittent attendance by PT after set up and before removal. Pt required multimodal cuing for proper technique and to facilitate improved neuromuscular control, strength, range of motion, and functional ability resulting in improved performance and form.  PATIENT EDUCATION:  Education details: Exercise purpose/form. Self management techniques. Estim.  Reviewed cancelation/no-show policy with patient and confirmed patient has correct phone number for clinic; patient verbalized understanding (09/08/22). Person educated: Patient Education method: Explanation, Demonstration, Tactile cues, and Verbal cues Education comprehension: verbalized understanding, returned demonstration, verbal cues required, tactile cues required, and needs further education  HOME EXERCISE PROGRAM: Access Code:  [email or web address] URL: [email or web address] Date: 09/16/2022 Prepared by: Norton Blizzard  Exercises - Supine Quad Set  - 1-2 x daily - 2 sets - 10 reps - 5 seconds hold - Active Straight Leg Raise with Quad Set  - 1-2 x daily - 2 sets - 10 reps - 5 seconds hold - Supine Heel Slide with Strap  - 1-2 x daily - 2 sets - 10 reps - 5 seconds hold - Long Sitting 4 Way Patellar Glide  - 1-2 x daily - 20-30 hold - Wall Quarter Squat  - 2 x daily - 5 reps - 30 seconds hold - 1.5 minutes rest  between sets - Bridge on Heels  - 3-5 x weekly - 3 sets - 10 reps - Step Up  - 3-5 x weekly - 3 sets - 10 reps  ASSESSMENT:  CLINICAL IMPRESSION: Patient arrives reporting increased knee pain after jogging about a mile on Sunday. Patient educated that per her protocol, it is too early for her to jog yet. Today's session continued with progressive strengthening and proprioceptive exercises for the left knee and LE. Patient continues to have some discomfort during session but demonstrates progress each session. Her 1 RM knee extension for the L knee is better each time and her FOTO score improved to 47 from 28 so far. Patient would benefit from continued management of limiting condition by skilled physical therapist to address remaining impairments and functional limitations to work towards stated goals and return to PLOF or maximal functional independence.   From initial PT evaluation 09/08/2022:  Patient is a 22 y.o. 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 Patient presents with significant pain, joint stiffness, ROM, motor control, muscle performance (strength/power/endurance), pain/fear avoidance, knowledge, activity tolerance, work capacity, athletic performance impairments that are limiting ability to complete her usual activities such as walking for a long period of time, bending, dressing, bathing, household responsibiltites, hiking, swimming, biking, jumping on  trampoline, climbing, playing college basketball, running, navigating stairs, etc, without difficulty. Patient will benefit from skilled physical therapy intervention to address current body structure impairments and activity limitations to improve function and work towards goals set in current POC in order to return to prior level of function or maximal functional improvement.   OBJECTIVE IMPAIRMENTS: Abnormal gait, decreased activity tolerance, decreased balance, decreased coordination, decreased endurance, decreased knowledge of condition, decreased knowledge of use of DME, decreased mobility, difficulty walking, decreased ROM, decreased strength, hypomobility, increased edema, impaired perceived functional ability, impaired flexibility, improper body mechanics, and pain.   ACTIVITY LIMITATIONS: carrying, lifting, bending, sitting, standing, squatting, stairs, transfers, bed mobility, bathing, dressing, and locomotion level, running, cutting, athletic performance activities.   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   REHAB POTENTIAL: Good  CLINICAL DECISION MAKING: Stable/uncomplicated  EVALUATION COMPLEXITY: Low   GOALS: Goals reviewed with patient? No  SHORT TERM GOALS: Target date: 09/22/2022  Patient will be independent with initial home exercise program for self-management of symptoms. Baseline: Initial HEP provided at IE (09/08/22); Goal status:  MET   LONG  TERM GOALS: Target date: 12/01/2022  Patient will be independent with a long-term home exercise program for self-management of symptoms.  Baseline: Initial HEP provided at IE (09/08/22); Goal status: In-progress  2.  Patient will demonstrate improved FOTO to equal or greater than 62 by visit #15 to demonstrate improvement in overall condition and self-reported functional ability.  Baseline: 28 (09/08/22); 47 at visit #5 (09/23/2022); Goal status: In-progress  3.  Patient will demonstrate L quad extension 80% or more of R quad extension strength on 1RM on knee extension machine to improve her ability to return to basketball. Baseline: L knee extension MMT 3/5 and painful (09/08/22); Goal status: In-progress  4.  Patient will demonstrate L knee PROM equal or greater than R knee PROM to improve her ability to return to playing college basketball and active hobbies.  Baseline: 5-0-130 with end range pain (09/08/22); Goal status: In-progress  5.  Patient will report pain with functional activity equal or less than 3/10 to improve her ability to return to running, basketball, stairs, etc.  Baseline: up to 9/10 (09/08/22); Goal status: In-progress  6.  Patient will demonstrate L LE composite score (ANT, PM, PL) on LOWER QUARTER Y-BALANCE TESTof 95% or greater than R LE to demonstrate improvements in L LE balance, motor control, and strength needed to return to college level basketball and active hobbies.  Baseline: deferred to later date (09/08/22); Goal status: In-progress    PLAN:  PT FREQUENCY: 1-2x/week  PT DURATION: 12 weeks  PLANNED INTERVENTIONS: Therapeutic exercises, Therapeutic activity, Neuromuscular re-education, Balance training, Gait training, Patient/Family education, Self Care, Joint mobilization, Stair training, DME instructions, Dry Needling, Electrical stimulation, Spinal mobilization, Cryotherapy, Moist heat, Taping, Manual therapy, and Re-evaluation  PLAN FOR NEXT  SESSION: , update HEP as appropriate, progressive LE/functional ROM, strengthening, proprioceptive, and balance exercises within guidelines of protocol. Education. Manual therapy and dry needling as needed.     Cira Rue, PT, DPT 09/23/2022, 3:05 PM   [facility] [address] [place], [place] [address] P: [phone] I F: [phone]

## 2022-09-30 ENCOUNTER — Ambulatory Visit: Payer: BC Managed Care – PPO | Admitting: Physical Therapy

## 2022-10-02 ENCOUNTER — Ambulatory Visit: Payer: BC Managed Care – PPO | Admitting: Physical Therapy

## 2022-10-02 ENCOUNTER — Encounter: Payer: Self-pay | Admitting: Physical Therapy

## 2022-10-02 DIAGNOSIS — M25662 Stiffness of left knee, not elsewhere classified: Secondary | ICD-10-CM

## 2022-10-02 DIAGNOSIS — R609 Edema, unspecified: Secondary | ICD-10-CM

## 2022-10-02 DIAGNOSIS — M25562 Pain in left knee: Secondary | ICD-10-CM | POA: Diagnosis not present

## 2022-10-02 DIAGNOSIS — M6281 Muscle weakness (generalized): Secondary | ICD-10-CM

## 2022-10-02 DIAGNOSIS — R262 Difficulty in walking, not elsewhere classified: Secondary | ICD-10-CM

## 2022-10-02 NOTE — Therapy (Signed)
OUTPATIENT PHYSICAL THERAPY TREATMENT NOTE   Patient Name: Toni Roberts MRN: [id number] DOB:02-16-2001, 22 y.o., female Today's Date: 10/02/2022  END OF SESSION:  PT End of Session - 10/02/22 1347     Visit Number 6    Number of Visits 24    Date for PT Re-Evaluation 12/01/22    Authorization Type [employer] reporting period from 09/08/2022    Authorization Time Period VL: 30 PT/OT/Chiro    Authorization - Visit Number 5    Authorization - Number of Visits 30    Progress Note Due on Visit 10    PT Start Time 1346    PT Stop Time 1447    PT Time Calculation (min) 61 min    Activity Tolerance Patient limited by pain;Patient tolerated treatment well;Patient limited by fatigue    Behavior During Therapy [facility] for tasks assessed/performed                 Past Medical History:  Diagnosis Date   Allergy    seasonal   Fracture, finger 2014   right hand   Heart murmur    recently dx by Dr Marney Setting mom said this is the 1st time a doctor has told her this-pt asymptomatic per Madolyn Frieze, pts mom   Torn ligament 2017   left foot   Past Surgical History:  Procedure Laterality Date   INGUINAL HERNIA REPAIR Right 07/01/2016   Primary repair right indirect inguinal hernia. Surgeon: Earline Mayotte, MD;  Location: [facility] ORS;  Service: General;  Laterality: Right;   NO PAST SURGERIES     Patient Active Problem List   Diagnosis Date Noted   Moderate episode of recurrent major depressive disorder (HCC) 12/22/2019   Anxiety disorder 08/09/2019   Attention deficit hyperactivity disorder (ADHD), combined type 08/09/2019   Other insomnia 08/09/2019   Non-recurrent unilateral inguinal hernia without obstruction or gangrene 06/11/2016   Peroneal tendon injury 08/30/2015   Left ankle swelling 08/30/2015   Plica syndrome of left knee 09/26/2014    PCP: Charlton Amor, MD   REFERRING PROVIDER: Berkley Harvey, MD   REFERRING DIAG: synovial plica  syndrome of left knee, 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  Patient denies hx of cancer, stroke, seizures, lung problems, heart  problems, diabetes, unexplained weight loss, unexplained changes in bowel or bladder problems, unexplained stumbling or dropping things, osteoporosis, and spinal surgery  SUBJECTIVE:   SUBJECTIVE STATEMENT: Patient arrives reporting her L knee is doing well. She has no pain and she has not been having pain unless she "starts doing something" or  forgets she has a knee problem and gets on the bed and it "bends too far or something."  PAIN:  Are you having pain? no NPRS scale: 0/10  PRECAUTIONS: WBAT with axillary crutches as needed 0-6 weeks post op, see protocol in chart for more detailed precautions.   WEIGHT BEARING RESTRICTIONS: Yes WBAT with axillary crutches as needed 0-6 weeks post op  PATIENT GOALS: "get back to playing college basketball"   OBJECTIVE  TODAY'S TREATMENT:  Therapeutic exercise: to centralize symptoms and improve ROM, strength, muscular endurance, and activity tolerance required for successful completion of functional activities.  - Recumbent Bike level 3, seat position 13. For improved lower extremity ROM, muscular endurance, and activity tolerance; and to induce the analgesic effect of aerobic exercise, stimulate joint nutrition, and prepare body structures and systems for following interventions. x 5  minutes. Required assistance to set up machine.  - quadruped rock back lowering buttocks towards heels, 2x10 with 5 second holds.  - standing L knee extension in closed chain with R foot on chair, edge of chair levered against L proximal tibia, 1x10-20 reps with various hold times. B UE support on TM and chair blocked on the treadmill.  - seated quad extension at seat position 2 on OMEGA machine, with B LE, 1x10 at 20#. No pain - seated single leg quad extension at seat position 2 on OMEGA machine, 4x10 L LE at 01/25/24/25# and 3x10  R LE at 01/25/24#. No pain.  - seated hamstring curl at seat position 2 on OMEGA machine, with B LE, 1x10 at 25#. Mild discomfort left lateral knee.  - seated single leg hamstring curl at seat position 2 on OMEGA machine, 4x10 L LE at 15# and 3x10  R LE at 15#. (Mild discomfort lateral left knee limiting increasing resistance).  - hooklying eccentric hamstring curl with heels on sliders, 2x10 with B LE.  - Education on HEP including handout   Modality: for improved quad  activation Seated RUSSIAN NMES estim to right quad, seated at knee extension machine, seat position 2, with knee at 60 degrees flexion and stabilized to not move, CC, 10/50 cycle, 50 bps, 50% duty cycle, 2 second ramp, anti-fatigue off. Intensity up to 82 mA   Skin WFL before and after application. Skin cleaned with soap and water prior to application of 5x3 inch square electrodes over distal quad and proximal motor point. MVIC tested just prior to application and found to be between 35#, able to reach 50% MVIC during NMES. Patient did not contribute volitional contraction during delivery of stimulation. 15 minutes. Patient tolerated well. Unattended/intermittent attendance by PT after set up and before removal.  Pt required multimodal cuing for proper technique and to facilitate improved neuromuscular control, strength, range of motion, and functional ability resulting in improved performance and form.  PATIENT EDUCATION:  Education details: Exercise purpose/form. Self management techniques. Estim.  Reviewed cancelation/no-show policy with patient and confirmed patient has correct phone number for clinic; patient verbalized understanding (09/08/22). Person educated: Patient Education method: Explanation, Demonstration, Tactile cues, and Verbal cues Education comprehension: verbalized understanding, returned demonstration, verbal cues required, tactile cues required, and needs further education  HOME EXERCISE PROGRAM: Access  Code: [email or web address] URL: [email or web address] Date: 10/02/2022 Prepared by: Norton Blizzard  Exercises - Supine Quad Set  - 1-2 x daily - 2 sets - 10 reps - 5 seconds hold - Active Straight Leg Raise with Quad Set  - 1-2 x daily - 2 sets - 10 reps - 5 seconds hold - Supine Heel Slide with Strap  - 1-2 x daily - 2 sets - 10 reps - 5 seconds hold - Long Sitting 4 Way Patellar Glide  - 1-2 x daily - 20-30 hold - Wall Quarter Squat  - 2 x daily - 5 reps - 30 seconds hold  - 1.5 minutes rest between sets - Bridge on Heels  - 3-5 x weekly - 3 sets - 10 reps - Step Up  - 3-5 x weekly - 3 sets - 10 reps - Supine Single Leg Eccentric Hamstring Bridge with Slider  - 3-5 x weekly - 3-4 sets - 10 reps  ASSESSMENT:  CLINICAL IMPRESSION: Patient is 5 weeks, 3 days s/p L knee surgery and in the later part of phase 1 of her rehab protocol. She should be ready to move into phase 2 next week. She arrives with significant improvement in pain and was able to progress to more vigorous strengthening exercises. She demonstrates full extension of L LE and is lacking just end range flexion compared to R LE. Plan to advance to phase II of protocol next session as tolerated. Patient would benefit from continued management of limiting condition by skilled physical therapist to address remaining impairments and functional limitations to work towards stated goals and return to PLOF or maximal functional independence.   From initial PT evaluation 09/08/2022:  Patient is a 22 y.o. 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 Patient presents with significant pain, joint stiffness, ROM, motor control, muscle performance (strength/power/endurance), pain/fear avoidance, knowledge, activity tolerance, work capacity, athletic performance impairments that are limiting ability to complete her usual activities such as walking for a long period of time, bending, dressing, bathing,  household responsibiltites, hiking, swimming, biking, jumping on trampoline, climbing, playing college basketball, running, navigating stairs, etc, without difficulty. Patient will benefit from skilled physical therapy intervention to address current body structure impairments and activity limitations to improve function and work towards goals set in current POC in order to return to prior level of function or maximal functional improvement.   OBJECTIVE IMPAIRMENTS: Abnormal gait, decreased activity tolerance, decreased balance, decreased coordination, decreased endurance, decreased knowledge of condition, decreased knowledge of use of DME, decreased mobility, difficulty walking, decreased ROM, decreased strength, hypomobility, increased edema, impaired perceived functional ability, impaired flexibility, improper body mechanics, and pain.   ACTIVITY LIMITATIONS: carrying, lifting, bending, sitting, standing, squatting, stairs, transfers, bed mobility, bathing, dressing, and locomotion level, running, cutting, athletic performance activities.   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   REHAB POTENTIAL: Good  CLINICAL DECISION MAKING: Stable/uncomplicated  EVALUATION COMPLEXITY: Low   GOALS: Goals reviewed with patient? No  SHORT TERM GOALS: Target date: 09/22/2022  Patient will be independent with initial home exercise program for self-management of symptoms. Baseline:  Initial HEP provided at IE (09/08/22); Goal status:  MET   LONG TERM GOALS: Target date: 12/01/2022  Patient will be independent with a long-term home exercise program for self-management of symptoms.  Baseline: Initial HEP provided at IE (09/08/22); Goal status: In-progress  2.  Patient will demonstrate improved FOTO to equal or greater than 62 by visit #15 to demonstrate improvement in overall condition and self-reported functional ability.  Baseline: 28 (09/08/22); 47 at visit #5 (09/23/2022); Goal status: In-progress  3.  Patient will demonstrate L quad extension 80% or more of R quad extension strength on 1RM on knee extension machine to improve her ability to return to basketball. Baseline: L knee extension MMT 3/5 and painful (09/08/22); Goal status: In-progress  4.  Patient will demonstrate L knee PROM equal or greater than R knee PROM to improve her ability to return to playing college basketball and active hobbies.  Baseline: 5-0-130 with end range pain (09/08/22); Goal status: In-progress  5.  Patient will report pain with functional activity equal or less than 3/10 to improve her ability to return to running, basketball, stairs, etc.  Baseline: up to 9/10 (09/08/22); Goal status: In-progress  6.  Patient will demonstrate L LE composite score (ANT, PM, PL) on LOWER QUARTER Y-BALANCE TESTof 95% or greater than R LE to demonstrate improvements in L LE balance, motor control, and strength needed to return to college level basketball and active hobbies.  Baseline: deferred to later date (09/08/22); Goal status: In-progress    PLAN:  PT FREQUENCY: 1-2x/week  PT DURATION: 12 weeks  PLANNED INTERVENTIONS: Therapeutic exercises, Therapeutic activity, Neuromuscular re-education, Balance training, Gait training, Patient/Family education, Self Care, Joint mobilization, Stair training, DME instructions, Dry Needling, Electrical stimulation, Spinal mobilization, Cryotherapy, Moist  heat, Taping, Manual therapy, and Re-evaluation  PLAN FOR NEXT SESSION: , update HEP as appropriate, progressive LE/functional ROM, strengthening, proprioceptive, and balance exercises within guidelines of protocol. Education. Manual therapy and dry needling as needed.     Cira Rue, PT, DPT 10/02/2022, 3:01 PM   [facility] [address] [place], [place] [address] P: [phone] I F: [phone]

## 2022-10-06 ENCOUNTER — Ambulatory Visit: Payer: BC Managed Care – PPO | Attending: Orthopedic Surgery

## 2022-10-06 DIAGNOSIS — R262 Difficulty in walking, not elsewhere classified: Secondary | ICD-10-CM | POA: Insufficient documentation

## 2022-10-06 DIAGNOSIS — M25662 Stiffness of left knee, not elsewhere classified: Secondary | ICD-10-CM | POA: Insufficient documentation

## 2022-10-06 DIAGNOSIS — M25562 Pain in left knee: Secondary | ICD-10-CM | POA: Diagnosis present

## 2022-10-06 DIAGNOSIS — M6281 Muscle weakness (generalized): Secondary | ICD-10-CM | POA: Insufficient documentation

## 2022-10-06 DIAGNOSIS — R609 Edema, unspecified: Secondary | ICD-10-CM | POA: Insufficient documentation

## 2022-10-06 NOTE — Therapy (Addendum)
OUTPATIENT PHYSICAL THERAPY TREATMENT   Patient Name: Toni Roberts MRN: [id number] DOB:08-28-2000, 22 y.o., female Today's Date: 10/06/2022  END OF SESSION:  PT End of Session - 10/06/22 1304     Visit Number 7    Number of Visits 24    Date for PT Re-Evaluation 12/01/22    Authorization Type [employer] reporting period from 09/08/2022    Authorization Time Period VL: 30 PT/OT/Chiro    Progress Note Due on Visit 10    PT Start Time 1301    PT Stop Time 1341    PT Time Calculation (min) 40 min    Activity Tolerance Patient limited by pain;Patient tolerated treatment well;Patient limited by fatigue    Behavior During Therapy [facility] for tasks assessed/performed              Past Medical History:  Diagnosis Date   Allergy    seasonal   Fracture, finger 2014   right hand   Heart murmur    recently dx by Dr Marney Setting mom said this is the 1st time a doctor has told her this-pt asymptomatic per Madolyn Frieze, pts mom   Torn ligament 2017   left foot   Past Surgical History:  Procedure Laterality Date   INGUINAL HERNIA REPAIR Right 07/01/2016   Primary repair right indirect inguinal hernia. Surgeon: Earline Mayotte, MD;  Location: [facility] ORS;  Service: General;  Laterality: Right;   NO PAST SURGERIES     Patient Active Problem List   Diagnosis Date Noted   Moderate episode of recurrent major depressive disorder (HCC) 12/22/2019   Anxiety disorder 08/09/2019   Attention deficit hyperactivity disorder (ADHD), combined type 08/09/2019   Other insomnia 08/09/2019   Non-recurrent unilateral inguinal hernia without obstruction or gangrene 06/11/2016   Peroneal tendon injury 08/30/2015   Left ankle swelling 08/30/2015   Plica syndrome of left knee 09/26/2014    PCP: Charlton Amor, MD   REFERRING PROVIDER: Berkley Harvey, MD   REFERRING DIAG: synovial plica syndrome of left knee, 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  Patient denies hx of cancer, stroke, seizures, lung problems, heart problems, diabetes, unexplained weight loss, unexplained changes in bowel or bladder problems, unexplained stumbling or dropping things, osteoporosis, and spinal surgery  SUBJECTIVE:   SUBJECTIVE STATEMENT: Pt denies any major updates. Her knee continues to feel predictably sore after each session, she thinks because of the electrical modalities used.   PAIN:  Are you having pain? no NPRS scale: 0/10  PRECAUTIONS: WBAT with  axillary crutches as needed 0-6 weeks post op, see protocol in chart for more detailed precautions.   WEIGHT BEARING RESTRICTIONS: WBAT, no device needed at this point   PATIENT GOALS: "get back to playing college basketball"   OBJECTIVE  TODAY'S TREATMENT:   Therapeutic exercise:  - recumbent Bike level 3, seat position 13 x 5 minutes; required assistance to set up machine            - quadruped rock back lowering buttocks towards heels, 2x10 with 5 second holds. (Pt at 145 degrees knee flexion) - avoided knee extension stretch on left side due to ROM being at WNL -2 degrees (hyperextension)   - seated single leg quad extension at seat position 2 on OMEGA machine, 4x10 LLE at 25#, R LE at 25#  - seated single leg hamstring curl at seat position 2 on OMEGA machine, 4x10 LLE at 15# and 4x10  R LE at 4x15#  Modality: for improved quad activation Seated RUSSIAN NMES estim to right quad, seated at knee extension machine, seat position 2, with knee at 60 degrees flexion and stabilized to not move, CC, 10/50 cycle, 50 bps, 50% duty cycle, 2 second ramp, anti-fatigue off. Intensity up to 82 mA. Skin WFL before and after application. Skin cleaned with soap and water prior to application of 5x3 inch square electrodes over distal quad and proximal motor point  PATIENT EDUCATION:  Education details: Can DC knee extension stretching as she is at zero degrees and avoiding recurvatum is prudent. Pt reports her injury to be recurvatum based upon landing.   HOME EXERCISE PROGRAM: Access Code: [email or web address] URL: [email or web address] Date: 10/02/2022 Prepared by: Norton Blizzard  Exercises - Supine Quad Set  - 1-2 x daily - 2 sets - 10 reps - 5 seconds hold - Active Straight Leg Raise with Quad Set  - 1-2 x daily - 2 sets - 10 reps - 5 seconds hold - Supine Heel Slide with Strap  - 1-2 x daily - 2 sets - 10 reps - 5 seconds hold - Long Sitting 4 Way Patellar Glide  - 1-2 x daily - 20-30  hold - Wall Quarter Squat  - 2 x daily - 5 reps - 30 seconds hold - 1.5 minutes rest between sets - Bridge on Heels  - 3-5 x weekly - 3 sets - 10 reps - Step Up  - 3-5 x weekly - 3 sets - 10 reps - Supine Single Leg Eccentric Hamstring Bridge with Slider  - 3-5 x weekly - 3-4 sets - 10 reps  ASSESSMENT:  CLINICAL IMPRESSION: Patient is 6 weeks s/p surgery and in the later part of phase 1 of her rehab protocol. Deferred transition into phase 2 until return of primary therapist next visit. Knee ROM is WNL this visit despite continued knee effusion, recommended cessation of extension stretches. Interventions tolerated as desired this date. ROM far beyond goal, strength improvements progressing steadily. Plan to advance to phase II of protocol next session as tolerated. Patient would benefit from continued management of limiting condition by skilled physical therapist to address remaining impairments and functional limitations to work towards stated goals and return to PLOF or maximal functional independence.    OBJECTIVE IMPAIRMENTS: Abnormal gait, decreased  activity tolerance, decreased balance, decreased coordination, decreased endurance, decreased knowledge of condition, decreased knowledge of use of DME, decreased mobility, difficulty walking, decreased ROM, decreased strength, hypomobility, increased edema, impaired perceived functional ability, impaired flexibility, improper body mechanics, and pain.   ACTIVITY LIMITATIONS: carrying, lifting, bending, sitting, standing, squatting, stairs, transfers, bed mobility, bathing, dressing, and locomotion level, running, cutting, athletic performance activities.   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   REHAB POTENTIAL: Good  CLINICAL DECISION MAKING: Stable/uncomplicated  EVALUATION COMPLEXITY: Low   GOALS: Goals reviewed with patient? No  SHORT TERM GOALS: Target date: 09/22/2022  Patient will be independent with initial home exercise program for self-management of symptoms. Baseline: Initial HEP provided at IE (09/08/22); Goal status:  MET   LONG TERM GOALS: Target date: 12/01/2022  Patient will be independent with a long-term home exercise program for self-management of symptoms.  Baseline: Initial HEP provided at IE (09/08/22); Goal status: In-progress  2.  Patient will demonstrate improved FOTO to equal or greater than 62 by visit #15 to demonstrate improvement in overall condition and self-reported functional ability.  Baseline: 28 (09/08/22); 47 at visit #5 (09/23/2022); Goal status: In-progress  3.  Patient will demonstrate L quad extension 80% or more of R quad extension strength on 1RM on knee extension machine to improve her ability to return to basketball. Baseline: L knee extension MMT 3/5 and painful (09/08/22); Goal status: In-progress  4.  Patient will demonstrate L knee PROM equal or greater than R knee PROM to improve her ability to return to playing college basketball and active hobbies.  Baseline: 5-0-130 with end range pain (09/08/22); 10/06/22: -2-145 degrees Left knee flexion Goal status: In-achieved   5.  Patient will report pain with functional activity equal or less than 3/10 to improve her ability to return to running, basketball, stairs, etc.  Baseline: up to 9/10 (09/08/22); Goal status: In-progress  6.  Patient will demonstrate L LE composite score (ANT, PM, PL) on LOWER  QUARTER Y-BALANCE TESTof 95% or greater than R LE to demonstrate improvements in L LE balance, motor control, and strength needed to return to college level basketball and active hobbies.  Baseline: deferred to later date (09/08/22); Goal status: In-progress  PLAN:  PT FREQUENCY: 1-2x/week  PT DURATION: 12 weeks  PLANNED INTERVENTIONS: Therapeutic exercises, Therapeutic activity, Neuromuscular re-education, Balance training, Gait training, Patient/Family education, Self Care, Joint mobilization, Stair training, DME instructions, Dry Needling, Electrical stimulation, Spinal mobilization, Cryotherapy, Moist heat, Taping, Manual therapy, and Re-evaluation  PLAN FOR NEXT SESSION: , update HEP as appropriate, progressive LE/functional ROM, strengthening, proprioceptive, and balance exercises within guidelines of protocol. Education. Manual therapy and dry needling as needed.     Aseneth Hack C, PT, DPT 10/06/2022, 1:05 PM   1:06 PM, 10/06/22 Rosamaria Lints, PT, DPT Physical Therapist - [place] [phone] (Office)   [facility] [address] [place], [place] [address] P: [phone] I F: [phone]  *addendum in 10/15/22 to include missing subjective information from visit.   3:58 PM, 10/15/22 Rosamaria Lints, PT, DPT Physical Therapist - [place] [phone] (Office)

## 2022-10-08 ENCOUNTER — Ambulatory Visit: Payer: BC Managed Care – PPO | Admitting: Physical Therapy

## 2022-10-13 ENCOUNTER — Ambulatory Visit: Payer: BC Managed Care – PPO | Admitting: Physical Therapy

## 2022-10-13 ENCOUNTER — Telehealth: Payer: Self-pay | Admitting: Physical Therapy

## 2022-10-13 NOTE — Telephone Encounter (Signed)
Called patient when she did not show up for her PT appointment scheduled at 9:45am this morning. Left VM notifying her of missed visit and requesting call back to reschedule and confirm next visit.   Toni Roberts. Ilsa Iha, PT, DPT 10/13/22, 11:00  [facility] [address] [place], [place] [address] P: [phone] I F: [phone]

## 2022-10-15 ENCOUNTER — Ambulatory Visit: Payer: BC Managed Care – PPO | Admitting: Physical Therapy

## 2022-10-15 ENCOUNTER — Encounter: Payer: Self-pay | Admitting: Physical Therapy

## 2022-10-15 DIAGNOSIS — M25562 Pain in left knee: Secondary | ICD-10-CM | POA: Diagnosis not present

## 2022-10-15 DIAGNOSIS — R609 Edema, unspecified: Secondary | ICD-10-CM

## 2022-10-15 DIAGNOSIS — M25662 Stiffness of left knee, not elsewhere classified: Secondary | ICD-10-CM

## 2022-10-15 DIAGNOSIS — M6281 Muscle weakness (generalized): Secondary | ICD-10-CM

## 2022-10-15 NOTE — Therapy (Addendum)
OUTPATIENT PHYSICAL THERAPY TREATMENT   Patient Name: Toni Roberts MRN: [id number] DOB:03/19/2001, 22 y.o., female Today's Date: 10/15/2022  END OF SESSION:  PT End of Session - 10/15/22 1358     Visit Number 8    Number of Visits 24    Date for PT Re-Evaluation 12/01/22    Authorization Type [employer] reporting period from 09/08/2022    Authorization Time Period VL: 30 PT/OT/Chiro    Authorization - Visit Number 8    Authorization - Number of Visits 30    Progress Note Due on Visit 10    PT Start Time 1355    PT Stop Time 1430    PT Time Calculation (min) 35 min    Activity Tolerance Patient limited by pain;Patient tolerated treatment well;Patient limited by fatigue    Behavior During Therapy [facility] for tasks assessed/performed               Past Medical History:  Diagnosis Date   Allergy    seasonal   Fracture, finger 2014   right hand   Heart murmur    recently dx by Dr Marney Setting mom said this is the 1st time a doctor has told her this-pt asymptomatic per Madolyn Frieze, pts mom   Torn ligament 2017   left foot   Past Surgical History:  Procedure Laterality Date   INGUINAL HERNIA REPAIR Right 07/01/2016   Primary repair right indirect inguinal hernia. Surgeon: Earline Mayotte, MD;  Location: [facility] ORS;  Service: General;  Laterality: Right;   NO PAST SURGERIES     Patient Active Problem List   Diagnosis Date Noted   Moderate episode of recurrent major depressive disorder (HCC) 12/22/2019   Anxiety disorder 08/09/2019   Attention deficit hyperactivity disorder (ADHD), combined type 08/09/2019   Other insomnia 08/09/2019   Non-recurrent unilateral inguinal hernia without obstruction or gangrene 06/11/2016   Peroneal tendon injury 08/30/2015   Left ankle swelling 08/30/2015   Plica syndrome of left knee 09/26/2014    PCP: Charlton Amor, MD   REFERRING PROVIDER: Berkley Harvey, MD   REFERRING DIAG: synovial plica syndrome of  left knee, 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  Patient denies hx of cancer, stroke, seizures, lung problems, heart problems, diabetes, unexplained weight loss, unexplained changes in bowel or  bladder problems, unexplained stumbling or dropping things, osteoporosis, and spinal surgery  SUBJECTIVE:   SUBJECTIVE STATEMENT: Patient states she has a little pain on the posterolateral knee L knee since yesterday. She has been active and started working on shooting and on her legs. She played a little basketball yesterday. She  did a little shooting and jogging up and down the court.   PAIN:  Are you having pain?  NPRS scale: 4/10 left posterolateral knee  Upcoming MD visit: 10/30/2022 1:00 PM with Dr. Lynford Humphrey.  PRECAUTIONS: protocol in chart for precautions.   WEIGHT BEARING RESTRICTIONS: Yes WBAT with axillary crutches as needed 0-6 weeks post op  PATIENT GOALS: "get back to playing college basketball"   OBJECTIVE RM Testing:  L extension: 10RM at 35# R extension: 8RM at 45# (calculated 10RM 42.7#).  LSI for 10RM = 81.8%   TODAY'S TREATMENT:  Therapeutic exercise: to centralize symptoms and improve ROM, strength, muscular endurance, and activity tolerance required for successful completion of functional activities.  - Treadmill 3.3 mph at 8.5 grade with no UE support. For improved lower extremity mobility, muscular endurance, and weightbearing activity tolerance; and to induce the analgesic effect of aerobic exercise, stimulate improved joint nutrition, and prepare body structures and systems for following interventions. x 6 minutes.  - quadruped rock back lowering buttocks towards heels, 2x10 with 5 second holds.(Heel touching buttocks on left by end of exercise).  - seated quad extension at seat position 2 on OMEGA machine, 1x10 BLE at 45#.  - seated single leg quad extension at seat position 2 on OMEGA machine, 4x8/10/8 LLE at 35#, R 3x10/8/6 LE at 35/45/45#  - seated hamstring curl at seat position 2 on OMEGA machine, 1x10 BLE at 25#.  - seated single leg hamstring curl at seat position 2 on OMEGA machine, 4x10 L LE at 15# and 3x10  R LE at 20# - AROM squat, 1x5, good form except slight shift away from L LE (improved with cuing).  - goblet squat with 20#KB, 1x10, cuing to shift more towards the left and squat deeper (to get top of thigh parallel to floor).  - back squat with 23# barbell, 1x10  Manual therapy: to reduce pain and tissue tension, improve range of motion, neuromodulation, in order to  promote improved ability to complete functional activities. SIDELYING - STM with "the stick" assist to left lateral thigh including vastus lateralis, biceps femoris, TFL, glute med fibers. Palpation over lateral knee revealed TTP at ITB attachment and distal biceps femoris. (Mild improvement in discomfort with hamstring curl).   Pt required multimodal cuing for proper technique and to facilitate improved neuromuscular control, strength, range of motion, and functional ability resulting in improved performance and form.  PATIENT EDUCATION:  Education details: Exercise purpose/form. Self management techniques. Estim.  Reviewed cancelation/no-show policy with patient and confirmed patient has correct phone number for clinic; patient verbalized understanding (09/08/22). Person educated: Patient Education method: Explanation, Demonstration, Tactile cues, and Verbal cues Education comprehension: verbalized understanding, returned demonstration, verbal cues required, tactile cues required, and needs further education  HOME EXERCISE PROGRAM: Access Code: [email or web address] URL: [email or web address] Date: 10/02/2022 Prepared by: Norton Blizzard  Exercises - Supine Quad Set  - 1-2 x daily - 2 sets - 10 reps - 5 seconds hold - Active Straight Leg Raise with Quad Set  - 1-2 x daily - 2 sets - 10 reps - 5 seconds hold - Supine Heel Slide with Strap  - 1-2 x daily - 2 sets - 10 reps -  5 seconds hold - Long Sitting 4 Way Patellar Glide  - 1-2 x daily - 20-30 hold - Wall Quarter Squat  - 2 x daily - 5 reps - 30 seconds hold - 1.5 minutes rest between sets - Bridge on Heels  - 3-5 x weekly - 3 sets - 10 reps - Step Up  - 3-5 x weekly - 3 sets - 10 reps - Supine Single Leg Eccentric Hamstring Bridge with Slider  - 3-5 x weekly - 3-4 sets - 10 reps  ASSESSMENT:  CLINICAL IMPRESSION: Patient with late arrival, so session was shorter than usual. Patient is 7 weeks s/p L knee surgery and started phase II  of protocol today. Patient demonstrates L quad strength approx 81.8% of right quad strength at knee extension machine for 10RM testing, which is above the 80% threshold to start sports-specific training. Patient educated how to start training at the gym and some starting some sport-specific drills that are in a more controlled environment than playing in games. Patient is very eager to return to play. Plan to continue with strength training and start more jumping and hopping next session as tolerated. Patient does seem to have some irritation at the lateral knee related to ITB dysfunction. Educated patient how to use a foam roller to decrease tension in surrounding soft tissue. IASTM applied to the region in clinic decreased discomfort with hamstring curl. Patient would benefit from continued management of limiting condition by skilled physical therapist to address remaining impairments and functional limitations to work towards stated goals and return to PLOF or maximal functional independence.   OBJECTIVE IMPAIRMENTS: Abnormal gait, decreased activity tolerance, decreased balance, decreased coordination, decreased endurance, decreased knowledge of condition, decreased knowledge of use of DME, decreased mobility, difficulty walking, decreased ROM, decreased strength, hypomobility, increased edema, impaired perceived functional ability, impaired flexibility, improper body mechanics, and pain.   ACTIVITY LIMITATIONS: carrying, lifting, bending, sitting, standing, squatting, stairs, transfers, bed mobility, bathing, dressing, and locomotion level, running, cutting, athletic performance activities.   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   REHAB POTENTIAL: Good  CLINICAL DECISION MAKING: Stable/uncomplicated  EVALUATION COMPLEXITY: Low   GOALS: Goals reviewed with patient? No  SHORT TERM GOALS: Target date: 09/22/2022  Patient will be independent with initial home exercise program for self-management of symptoms. Baseline: Initial HEP provided at IE (09/08/22); Goal status:  MET   LONG TERM GOALS: Target date: 12/01/2022  Patient will be independent with a long-term home exercise program for self-management of symptoms.  Baseline: Initial HEP provided at IE (09/08/22); Goal status: In-progress  2.  Patient will demonstrate improved FOTO to equal or greater than 62 by visit #15 to demonstrate improvement in overall condition and self-reported functional ability.  Baseline: 28 (09/08/22); 47 at visit #5 (09/23/2022); Goal status: In-progress  3.  Patient will demonstrate L quad extension 80% or more of R quad extension strength on 1RM on knee extension machine to improve her ability to return to basketball. Baseline: L knee extension MMT 3/5 and painful (09/08/22); Goal status: In-progress  4.  Patient will demonstrate L knee PROM equal or greater than R knee  PROM to improve her ability to return to playing college basketball and active hobbies.  Baseline: 5-0-130 with end range pain (09/08/22); 10/06/22: -2-145 degrees Left knee flexion Goal status: In-achieved   5.  Patient will report pain with functional activity equal or less than 3/10 to improve her ability to return to running, basketball, stairs, etc.  Baseline: up to 9/10 (09/08/22); Goal status: In-progress  6.  Patient will demonstrate L LE composite score (ANT, PM, PL) on LOWER QUARTER Y-BALANCE  TESTof 95% or greater than R LE to demonstrate improvements in L LE balance, motor control, and strength needed to return to college level basketball and active hobbies.  Baseline: deferred to later date (09/08/22); Goal status: In-progress  PLAN:  PT FREQUENCY: 1-2x/week  PT DURATION: 12 weeks  PLANNED INTERVENTIONS: Therapeutic exercises, Therapeutic activity, Neuromuscular re-education, Balance training, Gait training, Patient/Family education, Self Care, Joint mobilization, Stair training, DME instructions, Dry Needling, Electrical stimulation, Spinal mobilization, Cryotherapy, Moist heat, Taping, Manual therapy, and Re-evaluation  PLAN FOR NEXT SESSION: , update HEP as appropriate, progressive LE/functional ROM, strengthening, proprioceptive, and balance exercises within guidelines of protocol. Education. Manual therapy and dry needling as needed.     Cira Rue, PT, DPT 10/15/2022, 2:53 PM    Addendum to remove other therapist's signature left in error.  Luretha Murphy. Ilsa Iha, PT, DPT 10/29/22, 2:46 PM  [facility] [address] [place], [place] [address] P: [phone] I F: [phone]

## 2022-10-21 ENCOUNTER — Ambulatory Visit: Payer: BC Managed Care – PPO | Admitting: Physical Therapy

## 2022-10-23 ENCOUNTER — Ambulatory Visit: Payer: BC Managed Care – PPO | Admitting: Physical Therapy

## 2022-10-23 ENCOUNTER — Encounter: Payer: Self-pay | Admitting: Physical Therapy

## 2022-10-23 DIAGNOSIS — R609 Edema, unspecified: Secondary | ICD-10-CM

## 2022-10-23 DIAGNOSIS — R262 Difficulty in walking, not elsewhere classified: Secondary | ICD-10-CM

## 2022-10-23 DIAGNOSIS — M25562 Pain in left knee: Secondary | ICD-10-CM | POA: Diagnosis not present

## 2022-10-23 DIAGNOSIS — M6281 Muscle weakness (generalized): Secondary | ICD-10-CM

## 2022-10-23 DIAGNOSIS — M25662 Stiffness of left knee, not elsewhere classified: Secondary | ICD-10-CM

## 2022-10-23 NOTE — Therapy (Signed)
OUTPATIENT PHYSICAL THERAPY TREATMENT   Patient Name: Toni Roberts MRN: [id number] DOB:18-May-2000, 22 y.o., female Today's Date: 10/23/2022  END OF SESSION:  PT End of Session - 10/23/22 1527     Visit Number 9    Number of Visits 24    Date for PT Re-Evaluation 12/01/22    Authorization Type [employer] reporting period from 09/08/2022    Authorization Time Period VL: 30 PT/OT/Chiro    Authorization - Visit Number 9    Authorization - Number of Visits 30    Progress Note Due on Visit 10    PT Start Time 1527   patient arrived late   PT Stop Time 1555    PT Time Calculation (min) 28 min    Activity Tolerance Patient limited by pain;Patient tolerated treatment well    Behavior During Therapy [facility] for tasks assessed/performed                Past Medical History:  Diagnosis Date   Allergy    seasonal   Fracture, finger 2014   right hand   Heart murmur    recently dx by Dr Marney Setting mom said this is the 1st time a doctor has told her this-pt asymptomatic per Madolyn Frieze, pts mom   Torn ligament 2017   left foot   Past Surgical History:  Procedure Laterality Date   INGUINAL HERNIA REPAIR Right 07/01/2016   Primary repair right indirect inguinal hernia. Surgeon: Earline Mayotte, MD;  Location: [facility] ORS;  Service: General;  Laterality: Right;   NO PAST SURGERIES     Patient Active Problem List   Diagnosis Date Noted   Moderate episode of recurrent major depressive disorder (HCC) 12/22/2019   Anxiety disorder 08/09/2019   Attention deficit hyperactivity disorder (ADHD), combined type 08/09/2019   Other insomnia 08/09/2019   Non-recurrent unilateral inguinal hernia without obstruction or gangrene 06/11/2016   Peroneal tendon injury 08/30/2015   Left ankle swelling 08/30/2015   Plica syndrome of left knee 09/26/2014    PCP: Charlton Amor, MD   REFERRING PROVIDER: Berkley Harvey, MD   REFERRING DIAG: synovial plica syndrome of left  knee, 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  Patient denies hx of cancer, stroke, seizures, lung problems, heart problems,  diabetes, unexplained weight loss, unexplained changes in bowel or bladder problems, unexplained stumbling or dropping things, osteoporosis, and spinal surgery  SUBJECTIVE:   SUBJECTIVE STATEMENT: Patient reports the same place near her left fibular head still hurts and she has a little pain at her superior medial left knee. She has been doing leg  extension and hamstring curls, split squats, isometric lunge holds. She has been doing a little running recently. She tried doing some hills at [facility] and it started to hurt so she backed off. She noticed she had more pain when plants her left foot. She states she has pain for a couple hours after running up the hill.  PAIN:  Are you having pain?  NPRS scale: 5/10 left posterolateral knee  Upcoming MD visit: 10/30/2022 1:00 PM with Dr. Lynford Humphrey.  PRECAUTIONS: protocol in chart for precautions.   WEIGHT BEARING RESTRICTIONS: Yes WBAT with axillary crutches as needed 0-6 weeks post op  PATIENT GOALS: "get back to playing college basketball"   OBJECTIVE  TODAY'S TREATMENT:  Therapeutic exercise: to centralize symptoms and improve ROM, strength, muscular endurance, and activity tolerance required for successful completion of functional activities.  - Treadmill 3.3 mph at 8.5 grade with no UE support. For improved lower extremity mobility, muscular endurance, and weightbearing activity tolerance; and to induce the analgesic effect of aerobic exercise, stimulate improved joint nutrition, and prepare body structures and systems for following interventions. x 6 minutes.  - deep squat to check results of manual/dry needling (still has pain at lateral L knee) - double leg hop up to 4 inch step and step down 1x10.  - double leg drop squat from 4 inch step, 1x10 (pain with leading with L LE so discontinued) - education about strength training and jumping progressions for HEP  Manual therapy: to reduce pain and tissue tension, improve range of motion, neuromodulation, in order to promote improved ability to complete functional activities. SUPINE - STM to left quads, ITB, and TFL PRONE - STM to left biceps femoris, ITB, lateral thigh  Modality:  Dry needling performed to left thigh and anterior hip vastus to decrease pain at  patient's lateral knee region with patient in prone or supine  utilizing 3 dry needle(s) .30mm x 60mm with 2 sticks at L vastus lateralis, 2 sticks at L biceps femoris, and 1 stick at L TFL . Patient educated about the risks and benefits from therapy and verbally consents to treatment.  Dry needling performed by Luretha Murphy. Ilsa Iha PT, DPT who is certified in this technique.   Pt required multimodal cuing for proper technique and to facilitate improved neuromuscular control, strength, range of motion, and functional ability resulting in improved performance and form.  PATIENT EDUCATION:  Education details: Exercise purpose/form. Self management techniques. Estim.  Reviewed cancelation/no-show policy with patient and confirmed patient has correct phone number for clinic; patient verbalized understanding (09/08/22). Person educated: Patient Education method: Explanation, Demonstration, Tactile cues, and Verbal cues Education comprehension: verbalized understanding, returned demonstration, verbal cues required, tactile cues required, and needs further education  HOME EXERCISE PROGRAM: Access Code: [email or web address] URL: [email or web address] Date: 10/02/2022 Prepared by: Norton Blizzard  Exercises - Supine Quad Set  - 1-2 x daily - 2 sets - 10 reps - 5 seconds hold - Active Straight Leg Raise with Quad Set  - 1-2 x daily - 2 sets - 10 reps - 5 seconds hold - Supine Heel Slide with Strap  - 1-2 x daily - 2 sets - 10 reps -  5 seconds hold - Long Sitting 4 Way Patellar Glide  - 1-2 x daily - 20-30 hold - Wall Quarter Squat  - 2 x daily - 5 reps - 30 seconds hold - 1.5 minutes rest between sets - Bridge on Heels  - 3-5 x weekly - 3 sets - 10 reps - Step Up  - 3-5 x weekly - 3 sets - 10 reps - Supine Single Leg Eccentric Hamstring Bridge with Slider  - 3-5 x weekly - 3-4 sets - 10 reps  ASSESSMENT:  CLINICAL IMPRESSION: Patient with late arrival, so session was shorter than usual. Patient is 7 weeks s/p L knee surgery and started phase II of protocol today.  Patient demonstrates L quad strength approx 81.8% of right quad strength at knee extension machine for 10RM testing, which is above the 80% threshold to start sports-specific training. Patient educated how to start training at the gym and some starting some sport-specific drills that are in a more controlled environment than playing in games. Patient is very eager to return to play. Plan to continue with strength training and start more jumping and hopping next session as tolerated. Patient does seem to have some irritation at the lateral knee related to ITB dysfunction. Educated patient how to use a foam roller to decrease tension in surrounding soft tissue. IASTM applied to the region in clinic decreased discomfort with hamstring curl. Patient would benefit from continued management of limiting condition by skilled physical therapist to address remaining impairments and functional limitations to work towards stated goals and return to PLOF or maximal functional independence.   OBJECTIVE IMPAIRMENTS: Abnormal gait, decreased activity tolerance, decreased balance, decreased coordination, decreased endurance, decreased knowledge of condition, decreased knowledge of use of DME, decreased mobility, difficulty walking, decreased ROM, decreased strength, hypomobility, increased edema, impaired perceived functional ability, impaired flexibility, improper body mechanics, and pain.   ACTIVITY LIMITATIONS: carrying, lifting, bending, sitting, standing, squatting, stairs, transfers, bed mobility, bathing, dressing, and locomotion level, running, cutting, athletic performance activities.   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   REHAB POTENTIAL: Good  CLINICAL DECISION MAKING: Stable/uncomplicated  EVALUATION COMPLEXITY: Low   GOALS: Goals reviewed with patient? No  SHORT TERM GOALS: Target date: 09/22/2022  Patient will be independent with initial home exercise program for self-management of symptoms. Baseline: Initial HEP provided at IE (09/08/22); Goal status:  MET   LONG TERM GOALS: Target date: 12/01/2022  Patient will be independent with a long-term home exercise program for self-management of symptoms.  Baseline: Initial HEP provided at IE (09/08/22); Goal status: In-progress  2.  Patient will demonstrate improved FOTO to equal or greater than 62 by visit #15 to demonstrate improvement in overall condition and self-reported functional ability.  Baseline: 28 (09/08/22); 47 at visit #5 (09/23/2022); Goal status: In-progress  3.  Patient will demonstrate L quad extension 80% or more of R quad extension strength on 1RM on knee extension machine to improve her ability to return to basketball. Baseline: L knee extension MMT 3/5 and painful (09/08/22); Goal status: In-progress  4.  Patient will demonstrate L knee PROM equal or greater than R knee  PROM to improve her ability to return to playing college basketball and active hobbies.  Baseline: 5-0-130 with end range pain (09/08/22); 10/06/22: -2-145 degrees Left knee flexion Goal status: In-achieved   5.  Patient will report pain with functional activity equal or less than 3/10 to improve her ability to return to running, basketball, stairs, etc.  Baseline: up to 9/10 (09/08/22); Goal status: In-progress  6.  Patient will demonstrate L LE composite score (ANT, PM, PL) on LOWER QUARTER Y-BALANCE TESTof 95% or  greater than R LE to demonstrate improvements in L LE balance, motor control, and strength needed to return to college level basketball and active hobbies.  Baseline: deferred to later date (09/08/22); Goal status: In-progress  PLAN:  PT FREQUENCY: 1-2x/week  PT DURATION: 12 weeks  PLANNED INTERVENTIONS: Therapeutic exercises, Therapeutic activity, Neuromuscular re-education, Balance training, Gait training, Patient/Family education, Self Care, Joint mobilization, Stair training, DME instructions, Dry Needling, Electrical stimulation, Spinal mobilization, Cryotherapy, Moist heat, Taping, Manual therapy, and Re-evaluation  PLAN FOR NEXT SESSION: , update HEP as appropriate, progressive LE/functional ROM, strengthening, proprioceptive, and balance exercises within guidelines of protocol. Education. Manual therapy and dry needling as needed.     Cira Rue, PT, DPT 10/23/2022, 4:32 PM   Addendum to remove other therapists signature left in error. Luretha Murphy. Ilsa Iha, PT, DPT 10/29/22, 2:47 PM  [facility] [address] [place], [place] [address] P: [phone] I F: [phone]

## 2022-10-27 ENCOUNTER — Telehealth: Payer: Self-pay | Admitting: Physical Therapy

## 2022-10-27 ENCOUNTER — Ambulatory Visit: Payer: BC Managed Care – PPO | Admitting: Physical Therapy

## 2022-10-27 NOTE — Telephone Encounter (Signed)
Called patient when she did not show up for her PT appointment scheduled at 2:30pm today. Left VM notifying her of missed visit and requesting a call back to reschedule or confirm her next PT visit.   Toni Roberts. Ilsa Iha, PT, DPT 10/27/22, 2:51 PM  [facility] [address] [place], [place] [address] P: [phone] I F: [phone]

## 2022-10-29 ENCOUNTER — Ambulatory Visit: Payer: BC Managed Care – PPO | Admitting: Physical Therapy

## 2022-10-29 ENCOUNTER — Encounter: Payer: Self-pay | Admitting: Physical Therapy

## 2022-10-29 DIAGNOSIS — M25562 Pain in left knee: Secondary | ICD-10-CM

## 2022-10-29 DIAGNOSIS — R609 Edema, unspecified: Secondary | ICD-10-CM

## 2022-10-29 DIAGNOSIS — M6281 Muscle weakness (generalized): Secondary | ICD-10-CM

## 2022-10-29 DIAGNOSIS — R262 Difficulty in walking, not elsewhere classified: Secondary | ICD-10-CM

## 2022-10-29 DIAGNOSIS — M25662 Stiffness of left knee, not elsewhere classified: Secondary | ICD-10-CM

## 2022-10-29 NOTE — Therapy (Signed)
OUTPATIENT PHYSICAL THERAPY TREATMENT / PROGRESS NOTE  Dates of reporting from 09/08/2022 to 10/29/2022   Patient Name: Toni Roberts MRN: [id number] DOB:05-29-2000, 22 y.o., female Today's Date: 10/29/2022  END OF SESSION:  PT End of Session - 10/29/22 1351     Visit Number 10    Number of Visits 24    Date for PT Re-Evaluation 12/01/22    Authorization Type [employer] reporting period from 09/08/2022    Authorization Time Period VL: 30 PT/OT/Chiro    Authorization - Visit Number 10    Authorization - Number of Visits 30    Progress Note Due on Visit 10    PT Start Time 1347    PT Stop Time 1430    PT Time Calculation (min) 43 min    Activity Tolerance Patient limited by pain;Patient tolerated treatment well    Behavior During Therapy [facility] for tasks assessed/performed                 Past Medical History:  Diagnosis Date   Allergy    seasonal   Fracture, finger 2014   right hand   Heart murmur    recently dx by Dr Marney Setting mom said this is the 1st time a doctor has told her this-pt asymptomatic per Madolyn Frieze, pts mom   Torn ligament 2017   left foot   Past Surgical History:  Procedure Laterality Date   INGUINAL HERNIA REPAIR Right 07/01/2016   Primary repair right indirect inguinal hernia. Surgeon: Earline Mayotte, MD;  Location: [facility] ORS;  Service: General;  Laterality: Right;   NO PAST SURGERIES     Patient Active Problem List   Diagnosis Date Noted   Moderate episode of recurrent major depressive disorder (HCC) 12/22/2019   Anxiety disorder 08/09/2019   Attention deficit hyperactivity disorder (ADHD), combined type 08/09/2019   Other insomnia 08/09/2019   Non-recurrent unilateral inguinal hernia without obstruction or gangrene 06/11/2016   Peroneal tendon injury 08/30/2015   Left ankle swelling 08/30/2015   Plica syndrome of left knee 09/26/2014    PCP: Charlton Amor, MD   REFERRING PROVIDER: Berkley Harvey, MD    REFERRING DIAG: synovial plica syndrome of left knee, 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  Patient denies hx  of cancer, stroke, seizures, lung problems, heart problems, diabetes, unexplained weight loss, unexplained changes in bowel or bladder problems, unexplained stumbling or dropping things, osteoporosis, and spinal surgery  SUBJECTIVE:   SUBJECTIVE STATEMENT: Patient reports she states she continues to have pain at the lateral left knee and it is "not going  anywhere."  She thinks what was done at last PT session may have helped a little. She has been icing her knee and exercises and things. She just asked her brother's coach/trainer for some exercises she can do and he is going to put some things together to help and target that one spot. She did a workout yesterday in the gym, shooting, stopping and going again. It felt decent but the same spot on her knee is hurting.  PAIN:  Are you having pain?  NPRS scale: 3/10 overall L knee, 6/10 left posterolateral knee  Upcoming MD visit: 10/30/2022 1:00 PM with Dr. Lynford Humphrey.  PRECAUTIONS: protocol in chart for precautions.   WEIGHT BEARING RESTRICTIONS: Yes WBAT with axillary crutches as needed 0-6 weeks post op  PATIENT GOALS: "get back to playing college basketball"   OBJECTIVE  SELF-REPORTED FUNCTION FOTO score: 61/100 (knee questionnaire)  PASSIVE RANGE OF MOTION (PROM) *Indicates pain 09/08/22 10/29/22 Date  Joint/Motion R/L R/L R/L  Extension 8/5* /8* /  Flexion Sits on heel/130* /heel touching soft tissue, but not ischial tuberosity /  Comments:  09/08/2022: B LE appear grossly [facility] for basic mobility except as above   FUNCTIONAL TESTS  Y Balance test (5 practices before measurement taken):  right anterior: 68, 66, 66 average 67 right posteromedial: 101, 98, 98, average 99 right posterolateral: 95, 95, 96, average 95 Right Average composite score: 261  left anterior: 58, 55, 59 average 57 left posteromedial: 105, 98, 107, average 103 left posterolateral: 87, 92, 97, average 92 Left Average composite score: 252 LSI  = 96.6%  RM Testing:  L extension: 10RM at 40# R extension: 10RM at 45#  LSI for 10RM = 88.9%  TODAY'S TREATMENT:  Therapeutic exercise: to centralize symptoms and improve ROM, strength, muscular endurance, and activity tolerance required for successful completion of functional activities.  - Treadmill 3.3 mph at 8.5 grade with no UE support. For improved lower  extremity mobility, muscular endurance, and weightbearing activity tolerance; and to induce the analgesic effect of aerobic exercise, stimulate improved joint nutrition, and prepare body structures and systems for following interventions. x 5:30 minutes.  - quadruped rock back L knee flexion stretch, 1x20with 5 second holds.  - Y balance testing for lower quarter: 5 rounds per side practice, then 3 rounds with measurement (see above).  - seated quad extension, seat position 2 on OMEGA machine, 1x10 BLE at 45#.  - seated quad extension 1RM testing with seat position 2 on OMEGA machine (quad extension machine). R single leg 1x10 at 40# (unable to do more reps), L single leg 1x10 at 45# (unable to perform more reps).  - education about POC, strength training, and sport specific training   Pt required multimodal cuing for proper technique and to facilitate improved neuromuscular control, strength, range of motion, and functional ability resulting in improved performance and form.  PATIENT EDUCATION:  Education details: Exercise purpose/form. Self management techniques. Estim.  Reviewed cancelation/no-show policy with patient and confirmed patient has correct phone number for clinic; patient verbalized understanding (09/08/22). Person educated: Patient Education method: Explanation, Demonstration, Tactile cues, and Verbal cues Education comprehension: verbalized understanding, returned demonstration, verbal cues required, tactile cues required,  and needs further education  HOME EXERCISE PROGRAM: Access Code: [email or web address] URL: [email or web address] Date: 10/02/2022 Prepared by: Norton Blizzard  Exercises - Supine Quad Set  - 1-2 x daily - 2 sets - 10 reps - 5 seconds hold - Active Straight Leg Raise with Quad Set  - 1-2 x daily - 2 sets - 10 reps - 5 seconds hold - Supine Heel Slide with Strap  - 1-2 x daily - 2 sets - 10 reps - 5 seconds hold - Long Sitting 4 Way Patellar Glide  - 1-2 x  daily - 20-30 hold - Wall Quarter Squat  - 2 x daily - 5 reps - 30 seconds hold - 1.5 minutes rest between sets - Bridge on Heels  - 3-5 x weekly - 3 sets - 10 reps - Step Up  - 3-5 x weekly - 3 sets - 10 reps - Supine Single Leg Eccentric Hamstring Bridge with Slider  - 3-5 x weekly - 3-4 sets - 10 reps  ASSESSMENT:  CLINICAL IMPRESSION: Patient is 9 weeks, 2 days s/p L knee arthroscopy that included a limited synovectomy to the medial plica, limited chondroplasty consisting of medial femoral condyle debridement of grade 1 changes, and debridement to hypertrophic fat pad and ligamentum mucosum and is currently in phase II of her rehab protocol.  Patient has attended 10 physical therapy sessions since starting current episode of care on 09/08/2022. She has made good progress towards goals, meeting her short term goal and nearly meeting or meeting most of her long term goals. She has an LSI of 88.9% for 10RM on single leg knee extension machine and has an LSI  = 96.6% for the composite score on Y balance test. She is currently on track except for having pain at the left lateral knee close to the distal attachment of the ITB but with some discomfort to palpation over distal biceps femoris tendon. Attempted to mitigate this pain with soft tissue mobilization and dry needling to the vastus lateralis, biceps femoris, TFL and over the ITB without success. Patient also has some discomfort with single leg landing that has stopped her from transitioning to jogging. Plan to continue progression with tolerated strength, sport-specific and agility drills as tolerated and as deemed appropriate by referring physician. Patient has not yet returned to prior level of function or athletic ability. Patient would benefit from continued management of limiting condition by skilled physical therapist to address remaining impairments and functional limitations to work towards stated goals and return to PLOF or maximal functional  independence.   OBJECTIVE IMPAIRMENTS: Abnormal gait, decreased activity tolerance, decreased balance, decreased coordination, decreased endurance, decreased knowledge of condition, decreased knowledge of use of DME, decreased mobility, difficulty walking, decreased ROM, decreased strength, hypomobility, increased edema, impaired perceived functional ability, impaired flexibility, improper body mechanics, and pain.   ACTIVITY LIMITATIONS: carrying, lifting, bending, sitting, standing, squatting, stairs, transfers, bed mobility, bathing, dressing, and locomotion level, running, cutting, athletic performance activities.   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   REHAB POTENTIAL: Good  CLINICAL DECISION MAKING: Stable/uncomplicated  EVALUATION COMPLEXITY: Low   GOALS: Goals reviewed with patient? No  SHORT TERM GOALS: Target date: 09/22/2022  Patient will be independent with initial home exercise program for self-management of symptoms. Baseline: Initial HEP provided at IE (09/08/22); Goal status:  MET   LONG TERM GOALS: Target date: 12/01/2022  Patient will be independent with a long-term home exercise program for self-management of symptoms.  Baseline: Initial HEP provided at IE (09/08/22); patient participating well (10/29/2022);  Goal status: In-progress  2.  Patient will demonstrate improved FOTO to equal or greater than 62 by visit #15 to demonstrate  improvement in overall condition and self-reported functional ability.  Baseline: 28 (09/08/22); 47 at visit #5 (09/23/2022); 61 at visit #10 (10/29/2022);  Goal status: Nearly met  3.  Patient will demonstrate L quad extension 80% or more of R quad extension strength on 1RM on knee extension machine to improve her ability to return to basketball. Baseline: L knee extension MMT 3/5 and painful (09/08/22); LSI for 10RM = 88.9% (10/29/2022);  Goal status: Nearly met (testing from 10RM instead of 1RM)  4.  Patient will demonstrate L knee PROM equal or greater than R knee PROM to improve her ability to return to playing college basketball and active hobbies.  Baseline: 5-0-130 with end range pain (09/08/22);  -2-145 degrees Left knee flexion (10/06/22); L 8 degrees extension with end range pain, heel contacts soft tissue of buttock but not ischial tuberosity with flexion, R 8 degrees of extension with heel contacting ischial tuberosity with flexion (10/29/2022);  Goal status: Nearly met  5.  Patient will report pain with functional activity equal or less than 3/10 to improve her ability to return to running, basketball, stairs, etc.  Baseline: up to 9/10 (09/08/22); up to 6/10 at left lateral knee in last 2 weeks (10/29/2022);  Goal status: In-progress  6.  Patient will demonstrate L LE composite score (ANT, PM, PL) on LOWER QUARTER Y-BALANCE TEST of 95% or greater than R LE to demonstrate improvements in L LE balance, motor control, and strength needed to return to college level basketball and active hobbies.  Baseline: deferred to later date (09/08/22); LSI  = 96.6% (10/29/2022);  Goal status: MET  PLAN:  PT FREQUENCY: 1-2x/week  PT DURATION: 12 weeks  PLANNED INTERVENTIONS: Therapeutic exercises, Therapeutic activity, Neuromuscular re-education, Balance training, Gait training, Patient/Family education, Self Care, Joint mobilization, Stair training, DME instructions, Dry Needling, Electrical  stimulation, Spinal mobilization, Cryotherapy, Moist heat, Taping, Manual therapy, and Re-evaluation  PLAN FOR NEXT SESSION: update HEP as appropriate, progressive LE/functional ROM, strengthening, proprioceptive, and sport specific exercises within guidelines of protocol. Education. Manual therapy and dry needling as needed.     Cira Rue, PT, DPT 10/29/2022, 3:00 PM    [facility] [address] [place], [place] [address] P: [phone] I F: [phone]

## 2022-11-03 ENCOUNTER — Ambulatory Visit: Payer: BC Managed Care – PPO | Admitting: Physical Therapy

## 2022-11-05 ENCOUNTER — Ambulatory Visit: Payer: BC Managed Care – PPO | Admitting: Physical Therapy

## 2023-09-28 ENCOUNTER — Ambulatory Visit
Admission: RE | Admit: 2023-09-28 | Discharge: 2023-09-28 | Disposition: A | Discharge status: Home / Self Care | Source: Ambulatory Visit | Attending: Emergency Medicine | Admitting: Emergency Medicine

## 2023-09-28 ENCOUNTER — Ambulatory Visit (INDEPENDENT_AMBULATORY_CARE_PROVIDER_SITE_OTHER)

## 2023-09-28 VITALS — BP 106/66 | HR 64 | Temp 98.1°F | Resp 18

## 2023-09-28 DIAGNOSIS — R058 Other specified cough: Secondary | ICD-10-CM

## 2023-09-28 MED ORDER — AZITHROMYCIN 250 MG PO TABS: 250.0000 mg | ORAL_TABLET | Freq: Every day | ORAL | 0 refills | Status: AC

## 2023-09-28 NOTE — ED Triage Notes (Signed)
 Patient to Urgent Care with complaints of cough/ nasal congestion/ hoarseness/ headaches/ body aches.   Symptoms started last Sunday. Concerned about pneumonia.   Taking Mucinex-DM.

## 2023-09-28 NOTE — ED Provider Notes (Signed)
 [facility]    CSN: [id number] Arrival date & time: 09/28/23  1556      History   Chief Complaint Chief Complaint  Patient presents with   Cough    I [facility] went [facility] Tuesday and I had body aches, cold chills, a cough that was making my chest feel tight, head congestion and pink eye. Got drops for my eye which is better, but the other symptoms are still here even after musinexd. - Entered by patient    HPI Toni Roberts is a 23 y.o. female.  Patient presents with 1 week history of congestion and productive cough. Treating symptoms with Mucinex.  No fever or shortness of breath.  She is concerned for pneumonia and reports similar symptoms in the past with pneumonia.  Patient was seen at [facility] on 09/22/2023; diagnosed with bacterial conjunctivitis and viral URI with cough; treated with Polytrim eyedrops and pseudoephedrine-guaifenesin.  The history is provided by the patient and medical records.    Past Medical History:  Diagnosis Date   Allergy    seasonal   Fracture, finger 2014   right hand   Heart murmur    recently dx by Dr Marchia mom said this is the 1st time a doctor has told her this-pt asymptomatic per [name], pts mom   Torn ligament 2017   left foot    Patient Active Problem List   Diagnosis Date Noted   Moderate episode of recurrent major depressive disorder (HCC) 12/22/2019   Anxiety disorder 08/09/2019   Attention deficit hyperactivity disorder (ADHD), combined type 08/09/2019   Other insomnia 08/09/2019   Non-recurrent unilateral inguinal hernia without obstruction or gangrene 06/11/2016   Peroneal tendon injury 08/30/2015   Left ankle swelling 08/30/2015   Plica syndrome of left knee 09/26/2014    Past Surgical History:  Procedure Laterality Date   INGUINAL HERNIA REPAIR Right 07/01/2016   Primary repair right indirect inguinal hernia. Surgeon: Reyes LELON Cota, MD;  Location: [facility] ORS;  Service: General;  Laterality:  Right;   NO PAST SURGERIES      OB History     Gravida  0   Para  0   Term  0   Preterm  0   AB  0   Living  0      SAB  0   IAB  0   Ectopic  0   Multiple  0   Live Births  0        Obstetric Comments  1st Menstrual Cycle:  14             Home Medications    Prior to Admission medications   Medication Sig Start Date End Date Taking? Authorizing Provider  azithromycin (ZITHROMAX) 250 MG tablet Take 1 tablet (250 mg total) by mouth daily. Take first 2 tablets together, then 1 every day until finished. 09/28/23  Yes Corlis Burnard DEL, NP  atomoxetine  (STRATTERA ) 18 MG capsule TAKE 1 CAPSULE BY MOUTH DAILY. 04/11/20   Umrania, Hiren M, MD  buPROPion  (WELLBUTRIN  XL) 150 MG 24 hr tablet TAKE 1 TABLET BY MOUTH EVERY DAY 04/11/20   Umrania, Hiren M, MD  cetirizine (ZYRTEC) 10 MG tablet Take 10 mg by mouth as needed for allergies.    [provider]  cetirizine (ZYRTEC) 10 MG tablet Take by mouth.    [provider]  fluticasone (FLONASE) 50 MCG/ACT nasal spray Place 1 spray into both nostrils daily as needed for allergies  or rhinitis.    [provider]  fluticasone (FLONASE) 50 MCG/ACT nasal spray Place into the nose.    [provider]  hydrOXYzine  (ATARAX /VISTARIL ) 25 MG tablet Take 1 tablet (25 mg total) by mouth at bedtime as needed (Sleep). 02/13/20   Umrania, Hiren M, MD  Ketotifen Fumarate (ALLERGY EYE DROPS OP) Apply 1 drop to eye daily as needed (allergies).    [provider]  Olopatadine HCl 0.2 % SOLN INSTILL 1 2 DROPS INTO BOTH EYES ONCE A DAY 06/20/19   [provider]  Saline (OCEAN NASAL SPRAY NA) Place 1 spray into the nose as needed.    [provider]    Family History Family History  Problem Relation Age of Onset   Cancer Neg Hx     Social History Social History   Tobacco Use   Smoking status: Never   Smokeless tobacco: Never  Substance Use Topics   Alcohol use: No     Alcohol/week: 0.0 standard drinks of alcohol   Drug use: No     Allergies   Oxycodone   Review of Systems Review of Systems  Constitutional:  Positive for chills. Negative for fever.  HENT:  Positive for congestion. Negative for ear pain and sore throat.   Respiratory:  Positive for cough. Negative for shortness of breath.   Neurological:  Positive for headaches.     Physical Exam Triage Vital Signs ED Triage Vitals  Encounter Vitals Group     BP      Girls Systolic BP Percentile      Girls Diastolic BP Percentile      Boys Systolic BP Percentile      Boys Diastolic BP Percentile      Pulse      Resp      Temp      Temp src      SpO2      Weight      Height      Head Circumference      Peak Flow      Pain Score      Pain Loc      Pain Education      Exclude from Growth Chart    No data found.  Updated Vital Signs BP 106/66   Pulse 64   Temp 98.1 F (36.7 C)   Resp 18   SpO2 97%   Visual Acuity Right Eye Distance:   Left Eye Distance:   Bilateral Distance:    Right Eye Near:   Left Eye Near:    Bilateral Near:     Physical Exam Constitutional:      General: She is not in acute distress. HENT:     Right Ear: Tympanic membrane normal.     Left Ear: Tympanic membrane normal.     Nose: Nose normal.     Mouth/Throat:     Mouth: Mucous membranes are moist.     Pharynx: Oropharynx is clear.   Cardiovascular:     Rate and Rhythm: Normal rate and regular rhythm.     Heart sounds: Normal heart sounds.  Pulmonary:     Effort: Pulmonary effort is normal. No respiratory distress.     Breath sounds: Normal breath sounds.   Neurological:     Mental Status: She is alert.      UC Treatments / Results  Labs (all labs ordered are listed, but only abnormal results are displayed) Labs Reviewed - No data to display  EKG  Radiology DG Chest 2 View Result Date: 09/28/2023 CLINICAL DATA:  Cough, nasal congestion, hoarseness, headaches and body  aches. EXAM: CHEST - 2 VIEW COMPARISON:  April 25, 2018 FINDINGS: The heart size and mediastinal contours are within normal limits. Both lungs are clear. The visualized skeletal structures are unremarkable. IMPRESSION: No active cardiopulmonary disease. Electronically Signed   By: Suzen Dials M.D.   On: 09/28/2023 16:47    Procedures Procedures (including critical care time)  Medications Ordered in UC Medications - No data to display  Initial Impression / Assessment and Plan / UC Course  I have reviewed the triage vital signs and the nursing notes.  Pertinent labs & imaging results that were available during my care of the patient were reviewed by me and considered in my medical decision making (see chart for details).    Productive cough, acute upper respiratory infection.  Afebrile and vital signs are stable.  Lungs are clear at this time and O2 sat is 97% on room air.  Patient reports history of pneumonia.  CXR negative today.  Treating with Zithromax.  Instructed patient to follow up with her PCP tomorrow.  ED precautions discussed.  She agrees to plan of care.  Final Clinical Impressions(s) / UC Diagnoses   Final diagnoses:  Productive cough     Discharge Instructions      Take the Zithromax as directed.    Follow up with your primary care provider tomorrow.  Go to the emergency department if you have worsening symptoms.        ED Prescriptions     Medication Sig Dispense Auth. Provider   azithromycin (ZITHROMAX) 250 MG tablet Take 1 tablet (250 mg total) by mouth daily. Take first 2 tablets together, then 1 every day until finished. 6 tablet Corlis Burnard DEL, NP      PDMP not reviewed this encounter.   Corlis Burnard DEL, NP 09/28/23 [phone]

## 2023-09-28 NOTE — Discharge Instructions (Addendum)
 Take the Zithromax as directed.  Follow up with your primary care provider tomorrow.  Go to the emergency department if you have worsening symptoms.

## 2023-09-29 ENCOUNTER — Ambulatory Visit (HOSPITAL_COMMUNITY): Payer: Self-pay

## 2023-11-27 ENCOUNTER — Inpatient Hospital Stay
Admission: RE | Admit: 2023-11-27 | Discharge: 2023-11-27 | Disposition: A | Discharge status: Home / Self Care | Source: Ambulatory Visit | Attending: Pediatrics | Admitting: Pediatrics

## 2024-02-24 ENCOUNTER — Emergency Department
Admission: EM | Admit: 2024-02-24 | Discharge: 2024-02-24 | Disposition: A | Discharge status: Home / Self Care | Attending: Emergency Medicine | Admitting: Emergency Medicine

## 2024-02-24 ENCOUNTER — Other Ambulatory Visit: Payer: Self-pay

## 2024-02-24 ENCOUNTER — Emergency Department

## 2024-02-24 DIAGNOSIS — J069 Acute upper respiratory infection, unspecified: Secondary | ICD-10-CM | POA: Insufficient documentation

## 2024-02-24 DIAGNOSIS — R059 Cough, unspecified: Secondary | ICD-10-CM | POA: Diagnosis present

## 2024-02-24 DIAGNOSIS — R112 Nausea with vomiting, unspecified: Secondary | ICD-10-CM | POA: Insufficient documentation

## 2024-02-24 DIAGNOSIS — R197 Diarrhea, unspecified: Secondary | ICD-10-CM | POA: Diagnosis not present

## 2024-02-24 LAB — GROUP A STREP BY PCR: Group A Strep by PCR: NOT DETECTED

## 2024-02-24 LAB — RESP PANEL BY RT-PCR (RSV, FLU A&B, COVID)  RVPGX2
Influenza A by PCR: NEGATIVE
Influenza B by PCR: NEGATIVE
Resp Syncytial Virus by PCR: NEGATIVE
SARS Coronavirus 2 by RT PCR: NEGATIVE

## 2024-02-24 MED ORDER — ONDANSETRON 4 MG PO TBDP: 4.0000 mg | ORAL_TABLET | Freq: Four times a day (QID) | ORAL | 0 refills | Status: AC | PRN

## 2024-02-24 MED ORDER — ONDANSETRON 4 MG PO TBDP
4.0000 mg | ORAL_TABLET | Freq: Once | ORAL | Status: AC
  Administered 2024-02-24: 4 mg via ORAL
  Filled 2024-02-24: qty 1

## 2024-02-24 MED ORDER — KETOROLAC TROMETHAMINE 30 MG/ML IJ SOLN
60.0000 mg | Freq: Once | INTRAMUSCULAR | Status: AC
  Administered 2024-02-24: 60 mg via INTRAMUSCULAR
  Filled 2024-02-24: qty 2

## 2024-02-24 MED ORDER — IBUPROFEN 800 MG PO TABS
800.0000 mg | ORAL_TABLET | Freq: Once | ORAL | Status: DC
  Filled 2024-02-24: qty 1

## 2024-02-24 NOTE — ED Provider Notes (Signed)
 [facility] Provider Note    Event Date/Time   First MD Initiated Contact with Patient 02/24/24 [phone]     (approximate)   History   Cough   HPI  Toni Roberts is a 23 y.o. female with history of seasonal allergies who presents to the emergency department with complaints of cough, congestion, body aches, vomiting or diarrhea.  No urinary symptoms.  No abdominal pain.   History provided by patient, family.    Past Medical History:  Diagnosis Date   Allergy    seasonal   Fracture, finger 2014   right hand   Heart murmur    recently dx by Dr Marchia mom said this is the 1st time a doctor has told her this-pt asymptomatic per [name], pts mom   Torn ligament 2017   left foot    Past Surgical History:  Procedure Laterality Date   INGUINAL HERNIA REPAIR Right 07/01/2016   Primary repair right indirect inguinal hernia. Surgeon: Reyes LELON Cota, MD;  Location: [facility] ORS;  Service: General;  Laterality: Right;   NO PAST SURGERIES      MEDICATIONS:  Prior to Admission medications   Medication Sig Start Date End Date Taking? Authorizing Provider  atomoxetine  (STRATTERA ) 18 MG capsule TAKE 1 CAPSULE BY MOUTH DAILY. 04/11/20   Umrania, Hiren M, MD  azithromycin  (ZITHROMAX ) 250 MG tablet Take 1 tablet (250 mg total) by mouth daily. Take first 2 tablets together, then 1 every day until finished. 09/28/23   Corlis Burnard DEL, NP  buPROPion  (WELLBUTRIN  XL) 150 MG 24 hr tablet TAKE 1 TABLET BY MOUTH EVERY DAY 04/11/20   Umrania, Hiren M, MD  cetirizine (ZYRTEC) 10 MG tablet Take 10 mg by mouth as needed for allergies.    [provider]  cetirizine (ZYRTEC) 10 MG tablet Take by mouth.    [provider]  fluticasone (FLONASE) 50 MCG/ACT nasal spray Place 1 spray into both nostrils daily as needed for allergies or rhinitis.    [provider]  fluticasone (FLONASE) 50 MCG/ACT nasal spray Place into the nose.    [provider]   hydrOXYzine  (ATARAX /VISTARIL ) 25 MG tablet Take 1 tablet (25 mg total) by mouth at bedtime as needed (Sleep). 02/13/20   Umrania, Hiren M, MD  Ketotifen Fumarate (ALLERGY EYE DROPS OP) Apply 1 drop to eye daily as needed (allergies).    [provider]  Olopatadine HCl 0.2 % SOLN INSTILL 1 2 DROPS INTO BOTH EYES ONCE A DAY 06/20/19   [provider]  Saline (OCEAN NASAL SPRAY NA) Place 1 spray into the nose as needed.    [provider]    Physical Exam   Triage Vital Signs: ED Triage Vitals  Encounter Vitals Group     BP 02/24/24 0443 118/83     Girls Systolic BP Percentile --      Girls Diastolic BP Percentile --      Boys Systolic BP Percentile --      Boys Diastolic BP Percentile --      Pulse Rate 02/24/24 0443 96     Resp 02/24/24 0443 20     Temp 02/24/24 0443 99.2 F (37.3 C)     Temp Source 02/24/24 0443 Oral     SpO2 02/24/24 0443 100 %     Weight 02/24/24 0441 145 lb (65.8 kg)     Height 02/24/24 0441 5' 8 (1.727 m)     Head Circumference --  Peak Flow --      Pain Score 02/24/24 0441 6     Pain Loc --      Pain Education --      Exclude from Growth Chart --     Most recent vital signs: Vitals:   02/24/24 0443 02/24/24 0551  BP: 118/83 123/70  Pulse: 96 94  Resp: 20 18  Temp: 99.2 F (37.3 C) 99.7 F (37.6 C)  SpO2: 100% 100%    CONSTITUTIONAL: Alert, responds appropriately to questions. Well-appearing; well-nourished HEAD: Normocephalic, atraumatic EYES: Conjunctivae clear, pupils appear equal, sclera nonicteric ENT: normal nose; moist mucous membranes; No pharyngeal erythema or petechiae, no tonsillar hypertrophy or exudate, no uvular deviation, no unilateral swelling in posterior oropharynx, no trismus or drooling, no muffled voice, normal phonation, no stridor, airway patent. NECK: Supple, normal ROM CARD: RRR; S1 and S2 appreciated RESP: Normal chest excursion without splinting or tachypnea; breath sounds clear and  equal bilaterally; no wheezes, no rhonchi, no rales, no hypoxia or respiratory distress, speaking full sentences ABD/GI: Non-distended; soft, non-tender, no rebound, no guarding, no peritoneal signs BACK: The back appears normal EXT: Normal ROM in all joints; no deformity noted, no edema SKIN: Normal color for age and race; warm; no rash on exposed skin NEURO: Moves all extremities equally, normal speech PSYCH: The patient's mood and manner are appropriate.   ED Results / Procedures / Treatments   LABS: (all labs ordered are listed, but only abnormal results are displayed) Labs Reviewed  RESP PANEL BY RT-PCR (RSV, FLU A&B, COVID)  [id number]  GROUP A STREP BY PCR     EKG:   RADIOLOGY: My personal review and interpretation of imaging: Chest x-ray clear.  I have personally reviewed all radiology reports.   DG Chest Portable 1 View Result Date: 02/24/2024 EXAM: 1 VIEW(S) XRAY OF THE CHEST 02/24/2024 05:13:00 AM COMPARISON: 09/28/2023 CLINICAL HISTORY: cough and congestion FINDINGS: LUNGS AND PLEURA: No focal pulmonary opacity. No pleural effusion. No pneumothorax. HEART AND MEDIASTINUM: No acute abnormality of the cardiac and mediastinal silhouettes. BONES AND SOFT TISSUES: No acute osseous abnormality. IMPRESSION: 1. No acute cardiopulmonary process. Electronically signed by: Waddell Calk MD 02/24/2024 05:32 AM EST RP Workstation: [id number]     PROCEDURES:  Critical Care performed: No    Procedures    IMPRESSION / MDM / ASSESSMENT AND PLAN / ED COURSE  I reviewed the triage vital signs and the nursing notes.    Patient here with cough, congestion, vomiting, diarrhea.    DIFFERENTIAL DIAGNOSIS (includes but not limited to):   Viral illness, pneumonia, viral gastroenteritis, doubt sepsis, bacteremia, meningitis, UTI, appendicitis   Patient's presentation is most consistent with acute complicated illness / injury requiring diagnostic workup.   PLAN: Will obtain  COVID, flu, strep swabs, chest x-ray.  Will give Toradol , Zofran  for symptomatic relief.  Benign abdominal exam.  Does not appear toxic, septic.   MEDICATIONS GIVEN IN ED: Medications  ketorolac  (TORADOL ) 30 MG/ML injection 60 mg (60 mg Intramuscular Given 02/24/24 0510)  ondansetron  (ZOFRAN -ODT) disintegrating tablet 4 mg (4 mg Oral Given 02/24/24 0535)     ED COURSE: Chest x-ray reviewed and interpreted by myself and radiologist and shows no acute abnormality.  COVID, flu, RSV, strep negative.  Suspect viral illness causing patient's symptoms.  No indication for antibiotics.  Discussed supportive care instruction will provide with work note.  Will discharge with Zofran .  Patient tolerating p.o. here.   CONSULTS:  none   OUTSIDE RECORDS REVIEWED: Reviewed recent  sports medicine notes.       FINAL CLINICAL IMPRESSION(S) / ED DIAGNOSES   Final diagnoses:  Upper respiratory tract infection, unspecified type  Nausea vomiting and diarrhea     Rx / DC Orders   ED Discharge Orders          Ordered    ondansetron  (ZOFRAN -ODT) 4 MG disintegrating tablet  Every 6 hours PRN        02/24/24 0521             Note:  This document was prepared using Dragon voice recognition software and may include unintentional dictation errors.   Ellington Cornia, Josette SAILOR, DO 02/24/24 [phone]

## 2024-02-24 NOTE — ED Notes (Signed)
 Pt unable to swallow medication, Ward, DO made ware, see  MAR

## 2024-02-24 NOTE — Discharge Instructions (Addendum)
 You may alternate Tylenol  1000 mg every 6 hours as needed for fever and pain and ibuprofen 800 mg every 6 to 8 hours as needed for fever and pain. Please rest and drink plenty of fluids.   This is a viral illness causing your symptoms. You do not need antibiotics for a virus. Symptoms from a virus may take 7-14 days to run its course.  You may use over-the-counter nasal saline spray and Afrin nasal saline spray as needed for nasal congestion. Please do not use Afrin for more than 3 days in a row as this can cause worsening congestion. You may use guaifenesin and dextromethorphan as needed for cough.  You may use lozenges and Chloraseptic spray to help with sore throat.  Warm salt water gargles can also help with sore throat.  Honey has also been used successfully to help with cough, congestion.  You may use over-the-counter Unisom (doxyalamine) or Benadryl (diphenhydramine) to help with sleep.    Please note that some combination medicines such as DayQuil and NyQuil have multiple medications in them.  Please make sure you look at all labels to ensure that you are not taking too much of any one particular medication.

## 2024-02-24 NOTE — ED Triage Notes (Signed)
 Pt reports for the past week she has had cough congestion body aches and headache.
